# Patient Record
Sex: Female | Born: 1955
Health system: Southern US, Community
[De-identification: ages and names within clinical notes are randomized; demographics above are authoritative.]

## PROBLEM LIST (undated history)

## (undated) ENCOUNTER — Emergency Department (HOSPITAL_COMMUNITY): Admission: EM | Discharge status: Absent without leave | Payer: BC Managed Care – PPO

## (undated) DIAGNOSIS — D649 Anemia, unspecified: Secondary | ICD-10-CM

## (undated) DIAGNOSIS — E785 Hyperlipidemia, unspecified: Secondary | ICD-10-CM

## (undated) DIAGNOSIS — Z9289 Personal history of other medical treatment: Secondary | ICD-10-CM

## (undated) DIAGNOSIS — I10 Essential (primary) hypertension: Secondary | ICD-10-CM

## (undated) HISTORY — DX: Personal history of other medical treatment: Z92.89

## (undated) HISTORY — DX: Essential (primary) hypertension: I10

## (undated) HISTORY — DX: Anemia, unspecified: D64.9

## (undated) HISTORY — DX: Hyperlipidemia, unspecified: E78.5

---

## 2007-04-30 ENCOUNTER — Encounter: Admission: RE | Admit: 2007-04-30 | Discharge: 2007-04-30 | Payer: Self-pay | Admitting: Obstetrics and Gynecology

## 2010-10-02 ENCOUNTER — Encounter: Payer: Self-pay | Admitting: Obstetrics and Gynecology

## 2011-04-06 ENCOUNTER — Encounter: Payer: Self-pay | Admitting: Podiatry

## 2011-09-12 DIAGNOSIS — Z9289 Personal history of other medical treatment: Secondary | ICD-10-CM

## 2011-09-12 HISTORY — DX: Personal history of other medical treatment: Z92.89

## 2012-04-18 ENCOUNTER — Telehealth: Payer: Self-pay | Admitting: Oncology

## 2012-04-18 NOTE — Telephone Encounter (Signed)
lmonvm adviisng the pt to call me back to set up a new pt consult with dr ha at [facility]

## 2012-04-19 ENCOUNTER — Telehealth: Payer: Self-pay | Admitting: Oncology

## 2012-04-19 NOTE — Telephone Encounter (Signed)
lmonvm of the pt again regarding an new pt appt with dr Gaylyn Rong for next weekper dr cammie fulp

## 2012-04-22 ENCOUNTER — Telehealth: Payer: Self-pay | Admitting: Oncology

## 2012-04-22 NOTE — Telephone Encounter (Signed)
This is the second message left for this pt for her to return my call to set up,an appt. Pt has been referred by dr Jillyn Hidden

## 2012-04-23 ENCOUNTER — Telehealth: Payer: Self-pay | Admitting: Oncology

## 2012-04-23 NOTE — Telephone Encounter (Signed)
lmonvm of the pt regarding her needing a new pt appt with dr ha. The phone number given was not the correct phone number for the pt. Tried the pt at the [phone] exchange and lmonvm. Asked the pt to call me back asap.

## 2012-04-26 ENCOUNTER — Telehealth: Payer: Self-pay | Admitting: Oncology

## 2012-04-26 NOTE — Telephone Encounter (Signed)
The pt never called back to schedule an appt the chart has been returned back to the referring office

## 2012-05-09 ENCOUNTER — Other Ambulatory Visit: Payer: Self-pay | Admitting: Gastroenterology

## 2012-05-09 DIAGNOSIS — R748 Abnormal levels of other serum enzymes: Secondary | ICD-10-CM

## 2012-05-10 ENCOUNTER — Other Ambulatory Visit: Payer: Self-pay

## 2012-05-11 ENCOUNTER — Inpatient Hospital Stay (HOSPITAL_COMMUNITY): Payer: BC Managed Care – PPO

## 2012-05-11 ENCOUNTER — Encounter (HOSPITAL_COMMUNITY): Payer: Self-pay | Admitting: Emergency Medicine

## 2012-05-11 ENCOUNTER — Observation Stay (HOSPITAL_COMMUNITY)
Admission: EM | Admit: 2012-05-11 | Discharge: 2012-05-12 | DRG: 395 | Disposition: A | Discharge status: Home / Self Care | Payer: BC Managed Care – PPO | Attending: Internal Medicine | Admitting: Internal Medicine

## 2012-05-11 DIAGNOSIS — D6489 Other specified anemias: Principal | ICD-10-CM | POA: Diagnosis present

## 2012-05-11 DIAGNOSIS — R161 Splenomegaly, not elsewhere classified: Secondary | ICD-10-CM | POA: Diagnosis present

## 2012-05-11 DIAGNOSIS — D649 Anemia, unspecified: Secondary | ICD-10-CM

## 2012-05-11 DIAGNOSIS — I498 Other specified cardiac arrhythmias: Secondary | ICD-10-CM | POA: Diagnosis present

## 2012-05-11 DIAGNOSIS — Z791 Long term (current) use of non-steroidal anti-inflammatories (NSAID): Secondary | ICD-10-CM

## 2012-05-11 DIAGNOSIS — R000 Tachycardia, unspecified: Secondary | ICD-10-CM

## 2012-05-11 DIAGNOSIS — N1330 Unspecified hydronephrosis: Secondary | ICD-10-CM | POA: Diagnosis present

## 2012-05-11 DIAGNOSIS — R51 Headache: Secondary | ICD-10-CM | POA: Diagnosis present

## 2012-05-11 LAB — COMPREHENSIVE METABOLIC PANEL
ALT: 34 U/L (ref 0–35)
AST: 51 U/L — ABNORMAL HIGH (ref 0–37)
Albumin: 2.8 g/dL — ABNORMAL LOW (ref 3.5–5.2)
Alkaline Phosphatase: 177 U/L — ABNORMAL HIGH (ref 39–117)
BUN: 7 mg/dL (ref 6–23)
CO2: 25 mEq/L (ref 19–32)
Calcium: 9.1 mg/dL (ref 8.4–10.5)
Chloride: 101 mEq/L (ref 96–112)
Creatinine, Ser: 0.64 mg/dL (ref 0.50–1.10)
GFR calc Af Amer: >90 mL/min (ref >90)
GFR calc non Af Amer: >90 mL/min (ref >90)
Glucose, Bld: 133 mg/dL — ABNORMAL HIGH (ref 70–99)
Potassium: 3.9 mEq/L (ref 3.5–5.1)
Sodium: 134 mEq/L — ABNORMAL LOW (ref 135–145)
Total Bilirubin: 0.9 mg/dL (ref 0.3–1.2)
Total Protein: 7.8 g/dL (ref 6.0–8.3)

## 2012-05-11 LAB — CBC WITH DIFFERENTIAL/PLATELET
Basophils Absolute: 0.1 10*3/uL (ref 0.0–0.1)
Basophils Relative: 1 % (ref 0–1)
Eosinophils Absolute: 0.1 10*3/uL (ref 0.0–0.7)
Eosinophils Relative: 1 % (ref 0–5)
HCT: 21.2 % — ABNORMAL LOW (ref 36.0–46.0)
Hemoglobin: 6.5 g/dL — CL (ref 12.0–15.0)
Lymphocytes Relative: 40 % (ref 12–46)
Lymphs Abs: 2.6 10*3/uL (ref 0.7–4.0)
MCH: 23.7 pg — ABNORMAL LOW (ref 26.0–34.0)
MCHC: 30.7 g/dL (ref 30.0–36.0)
MCV: 77.4 fL — ABNORMAL LOW (ref 78.0–100.0)
Monocytes Absolute: 1.8 10*3/uL — ABNORMAL HIGH (ref 0.1–1.0)
Monocytes Relative: 27 % — ABNORMAL HIGH (ref 3–12)
Neutro Abs: 2 10*3/uL (ref 1.7–7.7)
Neutrophils Relative %: 31 % — ABNORMAL LOW (ref 43–77)
Platelets: 159 10*3/uL (ref 150–400)
RBC: 2.74 MIL/uL — ABNORMAL LOW (ref 3.87–5.11)
RDW: 21.9 % — ABNORMAL HIGH (ref 11.5–15.5)
WBC: 6.6 10*3/uL (ref 4.0–10.5)

## 2012-05-11 LAB — URINALYSIS, ROUTINE W REFLEX MICROSCOPIC
Bilirubin Urine: NEGATIVE
Glucose, UA: NEGATIVE mg/dL
Hgb urine dipstick: NEGATIVE
Ketones, ur: NEGATIVE mg/dL
Leukocytes, UA: NEGATIVE
Nitrite: NEGATIVE
Protein, ur: NEGATIVE mg/dL
Specific Gravity, Urine: 1.008 (ref 1.005–1.030)
Urobilinogen, UA: 1 mg/dL (ref 0.0–1.0)
pH: 7 (ref 5.0–8.0)

## 2012-05-11 LAB — RETICULOCYTES
RBC.: 2.74 MIL/uL — ABNORMAL LOW (ref 3.87–5.11)
Retic Count, Absolute: 208.2 10*3/uL — ABNORMAL HIGH (ref 19.0–186.0)
Retic Ct Pct: 7.6 % — ABNORMAL HIGH (ref 0.4–3.1)

## 2012-05-11 LAB — FERRITIN: Ferritin: 1088 ng/mL — ABNORMAL HIGH (ref 10–291)

## 2012-05-11 LAB — TROPONIN I: Troponin I: <0.3 ng/mL (ref <0.30)

## 2012-05-11 LAB — MRSA PCR SCREENING: MRSA by PCR: NEGATIVE

## 2012-05-11 LAB — PROTIME-INR
INR: 1.09 (ref 0.00–1.49)
Prothrombin Time: 14.3 seconds (ref 11.6–15.2)

## 2012-05-11 LAB — POCT I-STAT TROPONIN I: Troponin i, poc: 0 ng/mL (ref 0.00–0.08)

## 2012-05-11 LAB — FOLATE: Folate: 19.8 ng/mL

## 2012-05-11 LAB — TSH: TSH: 1.8 u[IU]/mL (ref 0.350–4.500)

## 2012-05-11 LAB — APTT: aPTT: 35 seconds (ref 24–37)

## 2012-05-11 LAB — MAGNESIUM: Magnesium: 1.8 mg/dL (ref 1.5–2.5)

## 2012-05-11 LAB — ABO/RH: ABO/RH(D): A POS

## 2012-05-11 LAB — IRON AND TIBC
Iron: 97 ug/dL (ref 42–135)
Saturation Ratios: 34 % (ref 20–55)
TIBC: 282 ug/dL (ref 250–470)
UIBC: 185 ug/dL (ref 125–400)

## 2012-05-11 LAB — PHOSPHORUS: Phosphorus: 3.4 mg/dL (ref 2.3–4.6)

## 2012-05-11 LAB — PREPARE RBC (CROSSMATCH)

## 2012-05-11 LAB — VITAMIN B12: Vitamin B-12: 1264 pg/mL — ABNORMAL HIGH (ref 211–911)

## 2012-05-11 LAB — OCCULT BLOOD, POC DEVICE: Fecal Occult Bld: NEGATIVE

## 2012-05-11 MED ORDER — LEVALBUTEROL HCL 0.63 MG/3ML IN NEBU
0.6300 mg | INHALATION_SOLUTION | Freq: Four times a day (QID) | RESPIRATORY_TRACT | Status: DC | PRN
  Filled 2012-05-11: qty 3

## 2012-05-11 MED ORDER — ONDANSETRON HCL 4 MG/2ML IJ SOLN: 4.0000 mg | Freq: Four times a day (QID) | INTRAMUSCULAR | Status: DC | PRN

## 2012-05-11 MED ORDER — ACETAMINOPHEN 325 MG PO TABS: 650.0000 mg | ORAL_TABLET | Freq: Four times a day (QID) | ORAL | Status: DC | PRN

## 2012-05-11 MED ORDER — SODIUM CHLORIDE 0.9 % IV SOLN
8.0000 mg/h | INTRAVENOUS | Status: DC
  Administered 2012-05-11 (×2): 8 mg/h via INTRAVENOUS
  Filled 2012-05-11 (×6): qty 80

## 2012-05-11 MED ORDER — SODIUM CHLORIDE 0.9 % IV SOLN
INTRAVENOUS | Status: DC
  Administered 2012-05-11: 14:00:00 via INTRAVENOUS
  Administered 2012-05-12: 1000 mL via INTRAVENOUS

## 2012-05-11 MED ORDER — ACETAMINOPHEN 650 MG RE SUPP: 650.0000 mg | Freq: Four times a day (QID) | RECTAL | Status: DC | PRN

## 2012-05-11 MED ORDER — SODIUM CHLORIDE 0.9 % IV SOLN
80.0000 mg | Freq: Once | INTRAVENOUS | Status: AC
  Administered 2012-05-11: 80 mg via INTRAVENOUS
  Filled 2012-05-11: qty 80

## 2012-05-11 MED ORDER — ONDANSETRON HCL 4 MG PO TABS: 4.0000 mg | ORAL_TABLET | Freq: Four times a day (QID) | ORAL | Status: DC | PRN

## 2012-05-11 NOTE — ED Provider Notes (Signed)
History     CSN: [id number]  Arrival date & time 05/11/12  0830   First MD Initiated Contact with Patient 05/11/12 [phone]      Chief Complaint  Patient presents with  . Anemia    (Consider location/radiation/quality/duration/timing/severity/associated sxs/prior treatment) The history is provided by the patient and a relative.    Kelly Arnold is a 56 y.o. female INAD advised to report to the emergency room for CV she states showing a hemoglobin of 6.4 yesterday. Patient did not come to the emergency room yesterday because she had house guest. Patient has reported malaise worsening over the last month. She reports DOE, and HA. She denies any chest pain, palpitations, abdominal pain, nausea or vomiting, diarrhea, hematemesis or hematochezia. She affirms dark stool and attributes that to iron supplementation. She's never had a colonoscopy. She has taken Motrin 2 times in the past several weeks for shoulder and neck pain. She denies any alcohol use. Patient recently traveled to [place] spent 3 weeks in the beginning of July. She was not prophylaxed for malaria. He should is postmenopausal by 3 years and has not experienced any postmenopausal bleed.  GI [facility]  PCP Fulp  History reviewed. No pertinent past medical history.  History reviewed. No pertinent past surgical history.  No family history on file.  History  Substance Use Topics  . Smoking status: Never Smoker   . Smokeless tobacco: Never Used  . Alcohol Use: No    OB History    Grav Para Term Preterm Abortions TAB SAB Ect Mult Living                  Review of Systems  Respiratory: Positive for shortness of breath.   Cardiovascular: Negative for chest pain, palpitations and leg swelling.  Gastrointestinal: Positive for abdominal pain. Negative for blood in stool and anal bleeding.  All other systems reviewed and are negative.    Allergies  Review of patient's allergies indicates no known allergies.  Home  Medications  No current outpatient prescriptions on file.  BP 120/59  Pulse 106  Temp 98.4 F (36.9 C) (Oral)  Resp 20  SpO2 100%  Physical Exam  Nursing note and vitals reviewed. Constitutional: She is oriented to person, place, and time. She appears well-developed and well-nourished. No distress.  HENT:  Head: Normocephalic and atraumatic.       Pale palpebral conjunctiva.  Eyes: Conjunctivae and EOM are normal. Pupils are equal, round, and reactive to light.  Neck: Normal range of motion. No JVD present.  Cardiovascular: Regular rhythm and intact distal pulses.  Exam reveals friction rub. Exam reveals no gallop.   No murmur heard.      Tachycardic  Pulmonary/Chest: Effort normal.  Abdominal: Soft. Bowel sounds are normal. She exhibits no distension and no mass. There is no tenderness. There is no rebound and no guarding.  Musculoskeletal: Normal range of motion.  Neurological: She is alert and oriented to person, place, and time.  Skin: Skin is warm.  Psychiatric: She has a normal mood and affect.    ED Course  Procedures (including critical care time)  Labs Reviewed  CBC WITH DIFFERENTIAL - Abnormal; Notable for the following:    RBC 2.74 (*)     Hemoglobin 6.5 (*)     HCT 21.2 (*)     MCV 77.4 (*)     MCH 23.7 (*)     RDW 21.9 (*)     All other components within normal limits  COMPREHENSIVE  METABOLIC PANEL - Abnormal; Notable for the following:    Sodium 134 (*)     Glucose, Bld 133 (*)     Albumin 2.8 (*)     AST 51 (*)     Alkaline Phosphatase 177 (*)     All other components within normal limits  RETICULOCYTES - Abnormal; Notable for the following:    Retic Ct Pct 7.6 (*)     RBC. 2.74 (*)     Retic Count, Manual 208.2 (*)     All other components within normal limits  TYPE AND SCREEN  URINALYSIS, ROUTINE W REFLEX MICROSCOPIC  PROTIME-INR  APTT  OCCULT BLOOD, POC DEVICE  ABO/RH  PREPARE RBC (CROSSMATCH)  VITAMIN B12  FOLATE  IRON AND TIBC    FERRITIN  OCCULT BLOOD X 1 CARD TO LAB, STOOL   No results found.   Date: 05/11/2012  Rate: 95  Rhythm: normal sinus rhythm  QRS Axis: normal  Intervals: normal  ST/T Wave abnormalities: normal  Conduction Disutrbances:none  Narrative Interpretation:   Old EKG Reviewed: none available    1. Anemia       MDM  56 y.o. female sent from GI Dr. for anemia. Will confirm a hemoglobin prep for transfusion and assessed cause with guaiac anemia panel. She is mildly tachycardic at 105 no respiratory issues saturating 100% on room air.   Guaiac is negative patient's hemoglobin is 6.5. I will crossmatch 2 units of pRBCs and start the first unit transfusion. She will beadmitted for symptomatic anemia of unknown origin.   Pt will be admitted to telemetry bed under Care of Dr. Susie Cassette, triad team 4         [facility], PA-C 05/11/12 1058

## 2012-05-11 NOTE — H&P (Signed)
Chief Complaint  Patient presents with  .  Anemia   GI Ganem  PCP Fulp   [place] Kelly Arnold is a 56 y.o. Female who was  advised to report to the emergency room for her anemia . she states that she was reported to have a  hemoglobin of 6.4 yesterday. Patient did not come to the emergency room yesterday because she had house guest. Patient has reported malaise worsening over the last month. She reports DOE, and HA. She denies any chest pain, palpitations, abdominal pain, nausea or vomiting, diarrhea, hematemesis or hematochezia. She affirms dark stool and attributes that to iron supplementation. She's never had a colonoscopy. She has taken Motrin 2 times in the past several weeks for shoulder and neck pain. She denies any alcohol use. Patient recently traveled to [place] spent 3 weeks in the beginning of July. She was not prophylaxed for malaria. She is postmenopausal by 3 years and has not experienced any postmenopausal bleed.    History reviewed. No pertinent past medical history.  History reviewed. No pertinent past surgical history.    Family history reviwed and negative .  History   Substance Use Topics   .  Smoking status:  Never Smoker   .  Smokeless tobacco:  Never Used   .  Alcohol Use:  No    OB History    Grav  Para  Term  Preterm  Abortions  TAB  SAB  Ect  Mult  Living                  Review of Systems  Respiratory: Positive for shortness of breath.  Cardiovascular: Negative for chest pain, palpitations and leg swelling.  Gastrointestinal: Positive for abdominal pain. Negative for blood in stool and anal bleeding.  Musculoskeletal: Arthritis, abnormal weakness of muscles  Breasts: New breast lumps, nipple discharge or irritation  Neurological: Dizziness, numbing/tingling, loss of balance when walking  Psychiatric: Depression, mood swings, significant and unexplained memory loss  Endocrine: Excessively hot or cold, unexplained weight loss or gain  Hematologic/Lymphatic:  Swollen glands, easy bruising  Allergic/Immunologic: Hives, runny nose, sneezing     Allergies   Review of patient's allergies indicates no known allergies.  Home Medications   No current outpatient prescriptions on file.  BP 120/59  Pulse 106  Temp 98.4 F (36.9 C) (Oral)  Resp 20  SpO2 100%  Physical Exam  Nursing note and vitals reviewed.  Constitutional: She is oriented to person, place, and time. She appears well-developed and well-nourished. No distress.  HENT:  Head: Normocephalic and atraumatic.  Pale palpebral conjunctiva.  Eyes: Conjunctivae and EOM are normal. Pupils are equal, round, and reactive to light.  Neck: Normal range of motion. No JVD present.  Cardiovascular: Regular rhythm and intact distal pulses. Exam reveals friction rub. Exam reveals no gallop.  No murmur heard. Tachycardic  Pulmonary/Chest: Effort normal.  Abdominal: Soft. Bowel sounds are normal. She exhibits no distension and no mass. There is no tenderness. There is no rebound and no guarding.  Musculoskeletal: Normal range of motion.  Neurological: She is alert and oriented to person, place, and time.  Skin: Skin is warm.  Psychiatric: She has a normal mood and affect.   ED Course   Procedures (including critical care time)  Labs Reviewed   CBC WITH DIFFERENTIAL - Abnormal; Notable for the following:    RBC  2.74 (*)      Hemoglobin  6.5 (*)      HCT  21.2 (*)  MCV  77.4 (*)      MCH  23.7 (*)      RDW  21.9 (*)      All other components within normal limits   COMPREHENSIVE METABOLIC PANEL - Abnormal; Notable for the following:    Sodium  134 (*)      Glucose, Bld  133 (*)      Albumin  2.8 (*)      AST  51 (*)      Alkaline Phosphatase  177 (*)      All other components within normal limits   RETICULOCYTES - Abnormal; Notable for the following:    Retic Ct Pct  7.6 (*)      RBC.  2.74 (*)      Retic Count, Manual  208.2 (*)      All other components within normal limits     TYPE AND SCREEN   OCCULT BLOOD, POC DEVICE   URINALYSIS, ROUTINE W REFLEX MICROSCOPIC   VITAMIN B12   FOLATE   IRON AND TIBC   FERRITIN   PROTIME-INR   APTT   OCCULT BLOOD X 1 CARD TO LAB, STOOL   ABO/RH   PREPARE RBC (CROSSMATCH)    No results found.  Date: 05/11/2012  Rate: 95  Rhythm: normal sinus rhythm  QRS Axis: normal  Intervals: normal  ST/T Wave abnormalities: normal  Conduction Disutrbances:none  Narrative Interpretation:  Old EKG Reviewed: none available   Assessment and Plan  1. sympotamatic anemia , likely upper GI source , takes motrin at home, will need an EGD , will notify Dr Evette Cristal   2. Headache rebound HA , will hold of on NSADIS , ASA , at this time  3. SOB check BNP, and 2 D ECHO , cycle enzymes  4. Sinus tachycardia due to anemia

## 2012-05-11 NOTE — ED Notes (Signed)
Pt not feeling well for several days, Went tp PCP who sent pt to GI physician. Labs done and pt received information from office that HGB 6.4 and told pt to come to ED

## 2012-05-12 DIAGNOSIS — I498 Other specified cardiac arrhythmias: Secondary | ICD-10-CM

## 2012-05-12 LAB — COMPREHENSIVE METABOLIC PANEL
ALT: 29 U/L (ref 0–35)
AST: 42 U/L — ABNORMAL HIGH (ref 0–37)
Albumin: 2.3 g/dL — ABNORMAL LOW (ref 3.5–5.2)
Alkaline Phosphatase: 135 U/L — ABNORMAL HIGH (ref 39–117)
BUN: 5 mg/dL — ABNORMAL LOW (ref 6–23)
CO2: 24 mEq/L (ref 19–32)
Calcium: 8.5 mg/dL (ref 8.4–10.5)
Chloride: 109 mEq/L (ref 96–112)
Creatinine, Ser: 0.68 mg/dL (ref 0.50–1.10)
GFR calc Af Amer: >90 mL/min (ref >90)
GFR calc non Af Amer: >90 mL/min (ref >90)
Glucose, Bld: 86 mg/dL (ref 70–99)
Potassium: 4.1 mEq/L (ref 3.5–5.1)
Sodium: 139 mEq/L (ref 135–145)
Total Bilirubin: 0.8 mg/dL (ref 0.3–1.2)
Total Protein: 6.6 g/dL (ref 6.0–8.3)

## 2012-05-12 LAB — CBC
HCT: 23.4 % — ABNORMAL LOW (ref 36.0–46.0)
Hemoglobin: 7.3 g/dL — ABNORMAL LOW (ref 12.0–15.0)
MCH: 24.4 pg — ABNORMAL LOW (ref 26.0–34.0)
MCHC: 31.2 g/dL (ref 30.0–36.0)
MCV: 78.3 fL (ref 78.0–100.0)
Platelets: 157 10*3/uL (ref 150–400)
RBC: 2.99 MIL/uL — ABNORMAL LOW (ref 3.87–5.11)
RDW: 21.7 % — ABNORMAL HIGH (ref 11.5–15.5)
WBC: 8 10*3/uL (ref 4.0–10.5)

## 2012-05-12 LAB — TROPONIN I
Troponin I: <0.3 ng/mL (ref <0.30)
Troponin I: <0.3 ng/mL (ref <0.30)

## 2012-05-12 LAB — PRO B NATRIURETIC PEPTIDE: Pro B Natriuretic peptide (BNP): 148.3 pg/mL — ABNORMAL HIGH (ref 0–125)

## 2012-05-12 MED ORDER — OMEPRAZOLE 40 MG PO CPDR: 40.0000 mg | DELAYED_RELEASE_CAPSULE | Freq: Two times a day (BID) | ORAL | Status: DC

## 2012-05-12 MED ORDER — FERROUS SULFATE 325 (65 FE) MG PO TABS: 325.0000 mg | ORAL_TABLET | Freq: Every day | ORAL | Status: DC

## 2012-05-12 NOTE — Discharge Summary (Signed)
Physician Discharge Summary  Kelly Arnold BJY:[id number] DOB: 1956-06-23 DOA: 05/11/2012  PCP: No primary provider on file.  Admit date: 05/11/2012 Discharge date: 05/12/2012  Recommendations for Outpatient Follow-up:  Follow-up Information    Follow up with FULP, CAMMIE, MD. (in 1-2weeks, call for appt upon discharge.)    Contact information:   [address] [place] [address] [place] [address] [phone]       Follow up with Graylin Shiver, MD on 05/14/2012. (as scheduled)    Contact information:   [address], [address] [place] [address] [phone]          Discharge Diagnoses:  Active Problems:  Normocytic anemia  Sinus tachycardia   Discharge Condition: Improved/stable  Diet recommendation: Regular diet  Filed Weights   05/11/12 1315 05/12/12 0400  Weight: 72.3 kg (159 lb 6.3 oz) 76.4 kg (168 lb 6.9 oz)    History of present illness:  Kelly Arnold is a 56 y.o. Female who was advised to report to the emergency room for her anemia . she states that she was reported to have a hemoglobin of 6.4 yesterday. Patient did not come to the emergency room yesterday because she had house guest. Patient has reported malaise worsening over the last month. She reports DOE, and HA. She denies any chest pain, palpitations, abdominal pain, nausea or vomiting, diarrhea, hematemesis or hematochezia. She affirms dark stool and attributes that to iron supplementation. She's never had a colonoscopy. She has taken Motrin 2 times in the past several weeks for shoulder and neck pain. She denies any alcohol use. Patient recently traveled to [place] spent 3 weeks in the beginning of July. She was not prophylaxed for malaria. She is 3 years postmenopausal  and has not experienced any postmenopausal bleed.  On followup today she denies dizziness, DOE chest pain, melena, hematochezia. She attributes the dark stools she was having prior to admission to supplemental  iron.  Hospital Course by problem list:  1. sympotamatic anemia ,  -possibly upper GI source , given history of Motrin use although she insists that she's only taken it twice recently. -GI consult for per Dr. Susie Cassette on 8/31, nursing staff contacted Dr. Katha Hamming. on call for GI today and he states he doesn't need to  see patient in house as he states he discussed with Dr. Susie Cassette, that she needs to keep the appointment scheduled on 9/3 with Dr. Evette Cristal at the office. -Hemoglobin status post transfusion of 2 units of packed red blood cells improved to 7.3, and patient denies any further symptoms at this time. -Patient instructed to avoid NSAIDs -she is being discharged on iron and Prilosec for outpatient followup as above 2. Headache rebound HA- as above pt instructed to avoid anti-inflammatories secondary to #1  3. DOE -likely secondary to #1, patient refused 2-D echo and BNP is unremarkable. 4. Sinus tachycardia -resolved with transfusion & hydration and due to anemia  5.splenomegaly -results of ultrasound discussed with patient, she is to followup with GI outpatient as discussed above   and may possibly need hematology referral. 6. mild hydronephrosis-patient is voiding without difficulty and creatinine within normal limits, discussed with patient and she is to followup her PCP outpatient.   Procedures:  NONE She declined 2-D echocardiogram Consultations:  GI-Dr. Ewing Schlein requested the patient to follow up outpatient with Dr.Ganem on 9/3 as scheduled.Marland Kitchen  Discharge Exam: Filed Vitals:   05/12/12 1100  BP: 115/69  Pulse: 80  Temp:   Resp: 21   Filed Vitals:  05/12/12 0800 05/12/12 0900 05/12/12 1000 05/12/12 1100  BP: 124/70 126/76 124/69 115/69  Pulse: 81 83 83 80  Temp: 98.8 F (37.1 C)     TempSrc: Oral     Resp: 18 17 17 21   Height:      Weight:      SpO2: 100% 100% 100% 100%    General: Middle aged black female, alert and oriented x3 in no apparent distress. Cardiovascular:  Regular rate and rhythm, S1-S2. No murmurs and no gallops Respiratory: Clear to auscultation bilaterally no crackles and no wheezes Abdomen: Soft bowel sounds present nontender nondistended no organomegaly and no masses palpable Extremities: No cyanosis and no edema  Discharge Instructions  Discharge Orders    Future Orders Please Complete By Expires   Diet general      Increase activity slowly        Medication List  As of 05/12/2012  2:00 PM   TAKE these medications         ferrous sulfate 325 (65 FE) MG tablet   Take 1 tablet (325 mg total) by mouth daily with breakfast.      multivitamin with minerals Tabs   Take 1 tablet by mouth daily.      omeprazole 40 MG capsule   Commonly known as: PRILOSEC   Take 1 capsule (40 mg total) by mouth 2 (two) times daily before a meal.           Patient instructed to avoid anti-inflammatories including Motrin Follow-up Information    Follow up with FULP, CAMMIE, MD. (in 1-2weeks, call for appt upon discharge.)    Contact information:   [address], [place] [address] [place] [address] [phone]       Follow up with Graylin Shiver, MD on 05/14/2012. (as scheduled)    Contact information:   [address] [place] [address] [phone]           The results of significant diagnostics from this hospitalization (including imaging, microbiology, ancillary and laboratory) are listed below for reference.    Significant Diagnostic Studies: US Abdomen Complete  05/11/2012  *RADIOLOGY REPORT*  Clinical Data:  Elevated liver function tests.  Anemia.  COMPLETE ABDOMINAL ULTRASOUND  Comparison:  None.  Findings:  Gallbladder:  No gallstones, gallbladder wall thickening, or pericholecystic fluid.  Common bile duct:  Normal, measuring 2.9 mm in diameter proximally.  Liver:  No focal lesion identified.  Within normal limits in parenchymal echogenicity.  IVC:  Appears normal.  Pancreas:  No focal abnormality seen.   Spleen:  Enlarged, with a calculated volume of 680 ml and length of approximately 15 cm.  It could not be included in its entirety on one image.  Right Kidney:  Mildly dilated collecting system.  Otherwise, normal, measuring 11.1 cm in length.  Left Kidney:  Normal, measuring 10.6 cm in length.  Abdominal aorta:  No aneurysm identified.  IMPRESSION:  1.  Splenomegaly. 2.  Normal appearing liver and no biliary obstruction. 3.  Mild right hydronephrosis.   Original Report Authenticated By: Darrol Angel, M.D.     Microbiology: Recent Results (from the past 240 hour(s))  MRSA PCR SCREENING     Status: Normal   Collection Time   05/11/12  1:50 PM      Component Value Range Status Comment   MRSA by PCR NEGATIVE  NEGATIVE Final      Labs: Basic Metabolic Panel:  Lab 05/12/12 2440 05/11/12 1411 05/11/12 0910  NA 139 -- 134*  K 4.1 -- 3.9  CL 109 -- 101  CO2 24 -- 25  GLUCOSE 86 -- 133*  BUN 5* -- 7  CREATININE 0.68 -- 0.64  CALCIUM 8.5 -- 9.1  MG -- 1.8 --  PHOS -- 3.4 --   Liver Function Tests:  Lab 05/12/12 0615 05/11/12 0910  AST 42* 51*  ALT 29 34  ALKPHOS 135* 177*  BILITOT 0.8 0.9  PROT 6.6 7.8  ALBUMIN 2.3* 2.8*   No results found for this basename: LIPASE:5,AMYLASE:5 in the last 168 hours No results found for this basename: AMMONIA:5 in the last 168 hours CBC:  Lab 05/12/12 0615 05/11/12 0910  WBC 8.0 6.6  NEUTROABS -- 2.0  HGB 7.3* 6.5*  HCT 23.4* 21.2*  MCV 78.3 77.4*  PLT 157 159   Cardiac Enzymes:  Lab 05/12/12 0615 05/11/12 2353 05/11/12 1411  CKTOTAL -- -- --  CKMB -- -- --  CKMBINDEX -- -- --  TROPONINI <0.30 <0.30 <0.30   BNP: BNP (last 3 results)  Basename 05/12/12 0615  PROBNP 148.3*   CBG: No results found for this basename: GLUCAP:5 in the last 168 hours  Time coordinating discharge: <30 minutes     Freddie Nghiem C  [facility] Pager [phone]. If 8PM-8AM, please contact night-coverage at [email or web address], password  [facility] 05/12/2012, 9:01 AM  LOS: 1 day

## 2012-05-12 NOTE — Progress Notes (Signed)
  Echocardiogram 2D Echocardiogram has not been performed due to patient refusal. RN notified.  Mikena Masoner 05/12/2012, 8:16 AM

## 2012-05-12 NOTE — Progress Notes (Signed)
Spoke with Dr. Suanne Marker.  Patient to be discharged home today.  Patient disconnected from monitors and IV per patient request.  Awaiting discharge orders.  Will continue to monitor.

## 2012-05-13 LAB — TYPE AND SCREEN
ABO/RH(D): A POS
Antibody Screen: NEGATIVE
Unit division: 0
Unit division: 0

## 2012-05-13 NOTE — Progress Notes (Signed)
[id number] Earlene Plater, RN, BSN, CCM: CHART REVIEWED AND UPDATED. NO DISCHARGE NEEDS PRESENT AT THIS TIME. CASE MANAGEMENT [phone]

## 2012-05-25 NOTE — ED Provider Notes (Signed)
Medical screening examination/treatment/procedure(s) were conducted as a shared visit with non-physician practitioner(s) and myself.  I personally evaluated the patient during the encounter.  Symptomatic anemia.  Will admit and transfuse.  Donnetta Hutching, MD 05/25/12 [phone]

## 2016-04-10 ENCOUNTER — Other Ambulatory Visit: Payer: Self-pay | Admitting: Family Medicine

## 2016-04-10 ENCOUNTER — Other Ambulatory Visit (HOSPITAL_COMMUNITY)
Admission: RE | Admit: 2016-04-10 | Discharge: 2016-04-10 | Disposition: A | Discharge status: Home / Self Care | Payer: BC Managed Care – PPO | Source: Ambulatory Visit | Attending: Family Medicine | Admitting: Family Medicine

## 2016-04-10 DIAGNOSIS — Z01419 Encounter for gynecological examination (general) (routine) without abnormal findings: Secondary | ICD-10-CM | POA: Insufficient documentation

## 2016-04-12 LAB — CYTOLOGY - PAP

## 2016-05-06 ENCOUNTER — Other Ambulatory Visit: Payer: Self-pay | Admitting: Family Medicine

## 2016-05-06 DIAGNOSIS — Z1231 Encounter for screening mammogram for malignant neoplasm of breast: Secondary | ICD-10-CM

## 2018-04-01 ENCOUNTER — Other Ambulatory Visit: Payer: Self-pay | Admitting: Obstetrics and Gynecology

## 2018-04-01 ENCOUNTER — Other Ambulatory Visit (HOSPITAL_COMMUNITY)
Admission: RE | Admit: 2018-04-01 | Discharge: 2018-04-01 | Disposition: A | Discharge status: Home / Self Care | Payer: BC Managed Care – PPO | Source: Ambulatory Visit | Attending: Obstetrics and Gynecology | Admitting: Obstetrics and Gynecology

## 2018-04-01 DIAGNOSIS — Z01411 Encounter for gynecological examination (general) (routine) with abnormal findings: Secondary | ICD-10-CM | POA: Diagnosis not present

## 2018-04-04 LAB — CYTOLOGY - PAP
Diagnosis: NEGATIVE
HPV 16/18/45 genotyping: NEGATIVE
HPV: DETECTED — AB

## 2018-04-12 ENCOUNTER — Other Ambulatory Visit: Payer: Self-pay | Admitting: Obstetrics and Gynecology

## 2018-04-12 DIAGNOSIS — Z1231 Encounter for screening mammogram for malignant neoplasm of breast: Secondary | ICD-10-CM

## 2018-05-29 ENCOUNTER — Ambulatory Visit
Admission: RE | Admit: 2018-05-29 | Discharge: 2018-05-29 | Disposition: A | Discharge status: Home / Self Care | Payer: BC Managed Care – PPO | Source: Ambulatory Visit | Attending: Obstetrics and Gynecology | Admitting: Obstetrics and Gynecology

## 2018-05-29 DIAGNOSIS — Z1231 Encounter for screening mammogram for malignant neoplasm of breast: Secondary | ICD-10-CM

## 2018-09-27 ENCOUNTER — Encounter: Payer: Self-pay | Admitting: Family Medicine

## 2018-09-27 ENCOUNTER — Ambulatory Visit: Payer: BC Managed Care – PPO | Attending: Internal Medicine | Admitting: Family Medicine

## 2018-09-27 VITALS — BP 137/80 | HR 56 | Temp 98.6°F | Resp 18

## 2018-09-27 DIAGNOSIS — Z862 Personal history of diseases of the blood and blood-forming organs and certain disorders involving the immune mechanism: Secondary | ICD-10-CM | POA: Diagnosis not present

## 2018-09-27 DIAGNOSIS — R5383 Other fatigue: Secondary | ICD-10-CM | POA: Diagnosis not present

## 2018-09-27 LAB — POCT URINALYSIS DIP (CLINITEK)
Bilirubin, UA: NEGATIVE
Blood, UA: NEGATIVE
Glucose, UA: NEGATIVE mg/dL
Ketones, POC UA: NEGATIVE mg/dL
Leukocytes, UA: NEGATIVE
Nitrite, UA: NEGATIVE
POC PROTEIN,UA: NEGATIVE
Spec Grav, UA: <=1.005 — AB
Urobilinogen, UA: 0.2 U/dL
pH, UA: 5.5

## 2018-09-27 NOTE — Progress Notes (Signed)
Subjective:    Patient ID: Kelly Arnold, female    DOB: 10/13/1955, 63 y.o.   MRN: [id number]  HPI       63 yo female new to the practice. On review of chart, patient has had prior hospitalization from 05/11/2012-05/12/2012 due to anemia with Hgb of 6.4. Patient had transfusion of 2 units of PRBC at that time with Hgb improving to 7.3. Patient had abnormal abdominal US showing enlargement of the spleen as well as mild right hydronephrosis and she was to follow-up with GI and establish with a PCP s/p hospital discharge. Patient was discharged at that time on iron supplement and prilosec. Patient did not keep follow-up with GI for EGD and colonoscopy.  She reports that her mother passed away in 08-03-2023 and her husband was sick and she had to care for him and this caused her to postpone her follow-up with GI. She has noticed that she has had some increased fatigue.  Patient denies any fever, chills or night sweats.  Patient denies urinary frequency, urgency or dysuria.  Patient has had no unusual bruising or bleeding.  Patient has noticed that she sometimes feels colder than other people.  Past Medical History:  Diagnosis Date  . History of transfusion   . Normocytic anemia    Past Surgical History:  Procedure Laterality Date  . CESAREAN SECTION     Family History  Problem Relation Age of Onset  . Hypertension Neg Hx   . Diabetes Neg Hx   . CAD Neg Hx   . CVA Neg Hx   . Cancer Neg Hx    Social History   Tobacco Use  . Smoking status: Never Smoker  . Smokeless tobacco: Never Used  Substance Use Topics  . Alcohol use: No  . Drug use: No   No Known Allergies     Review of Systems  Constitutional: Positive for fatigue. Negative for chills and fever.  HENT: Negative for sore throat and trouble swallowing.   Eyes: Negative for photophobia and visual disturbance.  Respiratory: Negative for cough and shortness of breath.   Cardiovascular: Negative for chest pain, palpitations and leg  swelling.  Gastrointestinal: Negative for abdominal pain, constipation, diarrhea and nausea.  Endocrine: Positive for cold intolerance. Negative for heat intolerance, polydipsia, polyphagia and polyuria.  Genitourinary: Negative for dysuria and frequency.  Musculoskeletal: Negative for arthralgias, back pain, gait problem, joint swelling and myalgias.  Neurological: Negative for dizziness and headaches.  Hematological: Negative for adenopathy. Does not bruise/bleed easily.       Objective:   Physical Exam Vitals signs and nursing note reviewed.  Constitutional:      General: She is not in acute distress.    Appearance: Normal appearance. She is not ill-appearing.  HENT:     Head: Normocephalic and atraumatic.     Right Ear: Tympanic membrane, ear canal and external ear normal.     Left Ear: Tympanic membrane, ear canal and external ear normal.     Nose: Nose normal. No congestion.     Mouth/Throat:     Mouth: Mucous membranes are moist.     Pharynx: Oropharynx is clear. No oropharyngeal exudate.  Eyes:     Extraocular Movements: Extraocular movements intact.     Conjunctiva/sclera: Conjunctivae normal.  Neck:     Musculoskeletal: Normal range of motion and neck supple. No muscular tenderness.     Vascular: No carotid bruit.  Cardiovascular:     Rate and Rhythm: Normal rate and regular  rhythm.     Heart sounds: Normal heart sounds. No murmur.  Pulmonary:     Effort: Pulmonary effort is normal.     Breath sounds: Normal breath sounds.  Abdominal:     Palpations: Abdomen is soft.     Tenderness: There is no abdominal tenderness. There is no right CVA tenderness, left CVA tenderness, guarding or rebound.  Musculoskeletal: Normal range of motion.        General: No swelling or tenderness.     Right lower leg: No edema.     Left lower leg: No edema.  Lymphadenopathy:     Cervical: No cervical adenopathy.  Skin:    General: Skin is warm and dry.     Findings: No bruising or  rash.  Neurological:     General: No focal deficit present.     Mental Status: She is alert and oriented to person, place, and time.  Psychiatric:        Mood and Affect: Mood normal.        Behavior: Behavior normal.        Thought Content: Thought content normal.        Judgment: Judgment normal.    BP 137/80 (BP Location: Left Arm, Patient Position: Sitting, Cuff Size: Large)   Pulse (!) 56   Temp 98.6 F (37 C) (Oral)   Resp 18   SpO2 100%         Assessment & Plan:  1. Fatigue, unspecified type Patient with complaint of fatigue and patient does have a history of anemia but has not followed up with GI for EGD and colonoscopy.  Patient is encouraged to reschedule her appointment with GI.  Patient will have CBC in follow-up of anemia but will also have complete metabolic panel, urinalysis and TSH to look for other causes of fatigue such as elevated blood sugar, electrolyte abnormality, liver dysfunction, urinary tract infection, glucosuria or thyroid disorder.  Healthy diet, regular exercise program and sleep hygiene with goal of 8 hours of sleep nightly encouraged.  Patient denies any current depressive symptoms. - Comprehensive metabolic panel - CBC with Differential - POCT URINALYSIS DIP (CLINITEK) - TSH  2. History of normocytic normochromic anemia Patient with history of normocytic normochromic anemia which could be secondary to chronic disease or to slow/chronic GI blood loss.  Patient is encouraged to reschedule follow-up with gastroenterology and patient will have CBC at today's visit done in follow-up.  Patient will have urinalysis to look for hematuria. - CBC with Differential -UA None none none An After Visit Summary was printed and given to the patient.  Allergies as of 09/27/2018   No Known Allergies     Medication List       Accurate as of September 27, 2018 11:59 PM. Always use your most recent med list.        B-complex with vitamin C tablet Take 1  tablet by mouth daily.   multivitamin with minerals Tabs tablet Take 1 tablet by mouth daily.      Return in about 6 weeks (around 11/08/2018).

## 2018-09-28 LAB — CBC WITH DIFFERENTIAL/PLATELET
Basophils Absolute: 0 x10E3/uL (ref 0.0–0.2)
Basos: 0 %
EOS (ABSOLUTE): 0.1 x10E3/uL (ref 0.0–0.4)
Eos: 1 %
Hematocrit: 38.4 % (ref 34.0–46.6)
Hemoglobin: 12.4 g/dL (ref 11.1–15.9)
Immature Grans (Abs): 0 x10E3/uL (ref 0.0–0.1)
Immature Granulocytes: 0 %
Lymphocytes Absolute: 2.7 x10E3/uL (ref 0.7–3.1)
Lymphs: 50 %
MCH: 23.8 pg — ABNORMAL LOW (ref 26.6–33.0)
MCHC: 32.3 g/dL (ref 31.5–35.7)
MCV: 74 fL — ABNORMAL LOW (ref 79–97)
Monocytes Absolute: 0.4 x10E3/uL (ref 0.1–0.9)
Monocytes: 8 %
Neutrophils Absolute: 2.2 x10E3/uL (ref 1.4–7.0)
Neutrophils: 41 %
Platelets: 241 x10E3/uL (ref 150–450)
RBC: 5.2 x10E6/uL (ref 3.77–5.28)
RDW: 14.2 % (ref 11.7–15.4)
WBC: 5.5 x10E3/uL (ref 3.4–10.8)

## 2018-09-28 LAB — COMPREHENSIVE METABOLIC PANEL WITH GFR
ALT: 15 IU/L (ref 0–32)
AST: 19 IU/L (ref 0–40)
Albumin/Globulin Ratio: 1.6 (ref 1.2–2.2)
Albumin: 4.5 g/dL (ref 3.6–4.8)
Alkaline Phosphatase: 79 IU/L (ref 39–117)
BUN/Creatinine Ratio: 22 (ref 12–28)
BUN: 14 mg/dL (ref 8–27)
Bilirubin Total: 0.3 mg/dL (ref 0.0–1.2)
CO2: 21 mmol/L (ref 20–29)
Calcium: 9.9 mg/dL (ref 8.7–10.3)
Chloride: 105 mmol/L (ref 96–106)
Creatinine, Ser: 0.65 mg/dL (ref 0.57–1.00)
GFR calc Af Amer: 112 mL/min/1.73
GFR calc non Af Amer: 97 mL/min/1.73
Globulin, Total: 2.9 g/dL (ref 1.5–4.5)
Glucose: 89 mg/dL (ref 65–99)
Potassium: 4.6 mmol/L (ref 3.5–5.2)
Sodium: 142 mmol/L (ref 134–144)
Total Protein: 7.4 g/dL (ref 6.0–8.5)

## 2018-09-28 LAB — TSH: TSH: 2.5 u[IU]/mL (ref 0.450–4.500)

## 2018-10-08 ENCOUNTER — Telehealth: Payer: Self-pay | Admitting: *Deleted

## 2018-10-08 NOTE — Telephone Encounter (Signed)
Medical Assistant left message on patient's home and cell voicemail. Voicemail states to give a call back to [place] with [facility] at [phone]. Patient is aware of all labs being normal.

## 2018-10-08 NOTE — Telephone Encounter (Signed)
-----   Message from Cain Saupe, MD sent at 10/07/2018 11:32 PM EST ----- Please notify patient of normal urinalysis, normal complete blood count, normal comprehensive metabolic panel and normal TSH

## 2018-11-08 ENCOUNTER — Encounter: Payer: Self-pay | Admitting: Family Medicine

## 2018-11-08 ENCOUNTER — Ambulatory Visit: Payer: BC Managed Care – PPO | Attending: Family Medicine | Admitting: Family Medicine

## 2018-11-08 VITALS — BP 133/72 | HR 65 | Temp 97.8°F | Resp 16 | Wt 173.0 lb

## 2018-11-08 DIAGNOSIS — R5383 Other fatigue: Secondary | ICD-10-CM | POA: Diagnosis not present

## 2018-11-08 DIAGNOSIS — Z1211 Encounter for screening for malignant neoplasm of colon: Secondary | ICD-10-CM

## 2018-11-08 MED ORDER — THERA M PLUS PO TABS: 1.0000 | ORAL_TABLET | Freq: Every day | ORAL | 11 refills | Status: DC

## 2018-11-08 NOTE — Progress Notes (Signed)
Subjective:    Patient ID: Kelly Arnold, female    DOB: [birth year], 63 y.o.   MRN: [id number]  HPI       63 year old female seen in follow-up of fatigue.  Patient reports that her fatigue has gotten slightly better since her last visit.  Patient reports no other symptoms at today's visit.  Patient states that she did consider the colonoscopy that we discussed at last visit and patient would like to be referred for colonoscopy.  Patient has had no blood in stool, no dark stools.  Patient denies chest pain or palpitations, no headaches or dizziness.  No urinary frequency, urgency or dysuria.  No increased thirst or blurred vision.  Patient does have some occasional bilateral knee pain and popping sensation in her knees.  Patient denies any family history of diabetes, hypertension, cancers or heart disease.  Patient would like a prescription for multivitamin as she thinks that this may help her to have more energy as well. Past Medical History:  Diagnosis Date  . History of transfusion   . Normocytic anemia    Past Surgical History:  Procedure Laterality Date  . CESAREAN SECTION     Family History  Problem Relation Age of Onset  . Hypertension Neg Hx   . Diabetes Neg Hx   . CAD Neg Hx   . CVA Neg Hx   . Cancer Neg Hx    Social History   Tobacco Use  . Smoking status: Never Smoker  . Smokeless tobacco: Never Used  Substance Use Topics  . Alcohol use: No  . Drug use: No  No Known Allergies            Review of Systems  Constitutional: Positive for fatigue. Negative for chills and fever.  HENT: Negative for sore throat and trouble swallowing.   Eyes: Negative for photophobia and visual disturbance.  Respiratory: Negative for cough and shortness of breath.   Cardiovascular: Negative for chest pain, palpitations and leg swelling.  Gastrointestinal: Negative for abdominal pain, anal bleeding, blood in stool, constipation, diarrhea and nausea.  Endocrine: Negative for cold  intolerance, heat intolerance, polydipsia, polyphagia and polyuria.  Genitourinary: Negative for dysuria and frequency.  Musculoskeletal: Negative for back pain and gait problem.       Occasional knee pain  Allergic/Immunologic: Negative for environmental allergies, food allergies and immunocompromised state.  Neurological: Negative for dizziness and headaches.  Hematological: Negative for adenopathy. Does not bruise/bleed easily.       Objective:   Physical Exam BP 133/72 (BP Location: Left Arm, Patient Position: Sitting, Cuff Size: Large)   Pulse 65   Temp 97.8 F (36.6 C) (Oral)   Resp 16   Wt 173 lb (78.5 kg)   SpO2 98%   BMI 32.69 kg/m Nurse's notes and vital signs reviewed  General-well-nourished, well-developed female in no acute distress Neck-supple, no lymphadenopathy, no thyromegaly, no carotid bruit Lungs-clear to auscultation bilaterally, breathing is unlabored Cardiovascular-regular rate and regular rhythm Abdomen-soft, nontender Back-no CVA tenderness Extremities-no edema Musculoskeletal-no reproducible knee pain Psych-normal mood and judgment      Assessment & Plan:  1. Fatigue, unspecified type Patient reports that her fatigue is slightly improved.  Patient had recent normal CBC, BMP and TSH which was discussed with the patient at today's visit.  Patient requests prescription for multivitamin.  Patient was told that insurance companies generally will not cover multivitamins and suggested that she try One-A-Day women's vitamins, Centrum silver or Equate brand multivitamin but also provided prescription  for a multivitamin.  We will also check vitamin D level to see if vitamin D deficiency  may be contributing to her fatigue. - Vitamin D, 25-hydroxy - Multiple Vitamins-Minerals (MULTIVITAMINS THER. W/MINERALS) TABS tablet; Take 1 tablet by mouth daily.  Dispense: 30 each; Refill: 11  2. Screening for colon cancer Patient will be referred for screening  colonoscopy - Ambulatory referral to Gastroenterology  An After Visit Summary was printed and given to the patient. Allergies as of 11/08/2018   No Known Allergies     Medication List       Accurate as of November 08, 2018  5:16 PM. Always use your most recent med list.        B-complex with vitamin C tablet Take 1 tablet by mouth daily.   multivitamin with minerals Tabs tablet Take 1 tablet by mouth daily.   multivitamins ther. w/minerals Tabs tablet Take 1 tablet by mouth daily.      Return if symptoms worsen or fail to improve, for schedule well female exam when needed and f/u if not feeling better.

## 2018-11-09 LAB — VITAMIN D 25 HYDROXY (VIT D DEFICIENCY, FRACTURES): Vit D, 25-Hydroxy: 13.6 ng/mL — ABNORMAL LOW (ref 30.0–100.0)

## 2018-11-10 ENCOUNTER — Other Ambulatory Visit: Payer: Self-pay | Admitting: Family Medicine

## 2018-11-10 DIAGNOSIS — E559 Vitamin D deficiency, unspecified: Secondary | ICD-10-CM

## 2018-11-10 MED ORDER — VITAMIN D (ERGOCALCIFEROL) 1.25 MG (50000 UNIT) PO CAPS: 50000.0000 [IU] | ORAL_CAPSULE | ORAL | 4 refills | Status: DC

## 2018-11-10 NOTE — Progress Notes (Signed)
Patient ID: Kelly Arnold, female   DOB: [birth year], 63 y.o.   MRN: [id number]   Patient is status post recent office visit in follow-up of fatigue.  Patient with normal CBC, BMP and TSH.  Vitamin D level was done at her recent visit and is low at 13.  Patient will be notified by medical assistant that a prescription will be sent into her pharmacy for vitamin D supplement and after completing 12 weeks of therapy with vitamin D supplement, it is recommended that patient take 1000 IUs of over-the-counter vitamin D3 daily.

## 2018-11-20 ENCOUNTER — Telehealth: Payer: Self-pay | Admitting: *Deleted

## 2018-11-20 NOTE — Telephone Encounter (Signed)
-----   Message from Cain Saupe, MD sent at 11/10/2018  2:57 PM EST ----- Please notify patient that her Vitamin D level was low at 13.6 with normal of 30 or greater and goal of 50 with normal range being 30-100. A prescription will be sent to her pharmacy for a vitamin D supplement that she should take once per week for 12 weeks and then take otc Vitamin D3 of at least 1,000 IU's daily

## 2018-11-20 NOTE — Telephone Encounter (Signed)
Medical Assistant left message on patient's home and cell voicemail. Voicemail states to give a call back to [place] with [facility] at [phone]. Patient is aware of vitamin d being low and needing to take weekly supplement with a recheck in 3-4 months.

## 2019-10-15 ENCOUNTER — Encounter: Payer: Self-pay | Admitting: Family Medicine

## 2019-10-15 ENCOUNTER — Other Ambulatory Visit: Payer: Self-pay

## 2019-10-15 ENCOUNTER — Ambulatory Visit: Payer: BC Managed Care – PPO | Attending: Family Medicine | Admitting: Family Medicine

## 2019-10-15 VITALS — BP 149/78 | HR 55 | Resp 16 | Wt 181.4 lb

## 2019-10-15 DIAGNOSIS — R030 Elevated blood-pressure reading, without diagnosis of hypertension: Secondary | ICD-10-CM | POA: Diagnosis not present

## 2019-10-15 DIAGNOSIS — Z1211 Encounter for screening for malignant neoplasm of colon: Secondary | ICD-10-CM | POA: Diagnosis not present

## 2019-10-15 DIAGNOSIS — E559 Vitamin D deficiency, unspecified: Secondary | ICD-10-CM | POA: Diagnosis not present

## 2019-10-15 DIAGNOSIS — M25561 Pain in right knee: Secondary | ICD-10-CM

## 2019-10-15 DIAGNOSIS — Z862 Personal history of diseases of the blood and blood-forming organs and certain disorders involving the immune mechanism: Secondary | ICD-10-CM

## 2019-10-15 NOTE — Patient Instructions (Addendum)
Please schedule a 4 week follow up with Kelly Arnold for bp check   Acute Knee Pain, Adult Many things can cause knee pain. Sometimes, knee pain is sudden (acute) and may be caused by damage, swelling, or irritation of the muscles and tissues that support your knee. The pain often goes away on its own with time and rest. If the pain does not go away, tests may be done to find out what is causing the pain. Follow these instructions at home: Pay attention to any changes in your symptoms. Take these actions to relieve your pain. If you have a knee sleeve or brace:   Wear the sleeve or brace as told by your doctor. Remove it only as told by your doctor.  Loosen the sleeve or brace if your toes: ? Tingle. ? Become numb. ? Turn cold and blue.  Keep the sleeve or brace clean.  If the sleeve or brace is not waterproof: ? Do not let it get wet. ? Cover it with a watertight covering when you take a bath or shower. Activity  Rest your knee.  Do not do things that cause pain.  Avoid activities where both feet leave the ground at the same time (high-impact activities). Examples are running, jumping rope, and doing jumping jacks.  Work with a physical therapist to make a safe exercise program, as told by your doctor. Managing pain, stiffness, and swelling   If told, put ice on the knee: ? Put ice in a plastic bag. ? Place a towel between your skin and the bag. ? Leave the ice on for 20 minutes, 2-3 times a day.  If told, put pressure (compression) on your injured knee to control swelling, give support, and help with discomfort. Compression may be done with an elastic bandage. General instructions  Take all medicines only as told by your doctor.  Raise (elevate) your knee while you are sitting or lying down. Make sure your knee is higher than your heart.  Sleep with a pillow under your knee.  Do not use any products that contain nicotine or tobacco. These include cigarettes, e-cigarettes,  and chewing tobacco. These products may slow down healing. If you need help quitting, ask your doctor.  If you are overweight, work with your doctor and a food expert (dietitian) to set goals to lose weight. Being overweight can make your knee hurt more.  Keep all follow-up visits as told by your doctor. This is important. Contact a doctor if:  The knee pain does not stop.  The knee pain changes or gets worse.  You have a fever along with knee pain.  Your knee feels warm when you touch it.  Your knee gives out or locks up. Get help right away if:  Your knee swells, and the swelling gets worse.  You cannot move your knee.  You have very bad knee pain. Summary  Many things can cause knee pain. The pain often goes away on its own with time and rest.  Your doctor may do tests to find out the cause of the pain.  Pay attention to any changes in your symptoms. Relieve your pain with rest, medicines, light activity, and use of ice.  Get help right away if you cannot move your knee or your knee pain is very bad. This information is not intended to replace advice given to you by your health care provider. Make sure you discuss any questions you have with your health care provider. Document Revised: 02/07/2018 Document  Reviewed: 02/07/2018 Elsevier Patient Education  [employer].

## 2019-10-15 NOTE — Progress Notes (Signed)
Established Patient Office Visit  Subjective:  Patient ID: Kelly Arnold, female    DOB: Dec 21, 1955  Age: 64 y.o. MRN: [id number]  CC:  Chief Complaint  Patient presents with  . Knee Pain    HPI Kelly Arnold, 64 year old female, who presents secondary to recent onset of right knee pain.  She does not recall any acute injury but started having onset of pain along the sides of her right knee.  She reports that she had stepped over into her bathtub to take a shower and reached down to pick up something then straightened back up and noticed some discomfort in the right knee but she does not believe that there was any actual injury to the knee as she did not have any acute onset of pain or swelling at that time.  The next day however she started having increased pain in her knee.  Pain is usually not present if she is sitting still but as soon as she attempts to stand up or starts walking then she has increased pain.  Pain can range from a 6-8 on a 0-to-10 scale.  She has not tried any medications by mouth to help with the pain.  She reports that she actually did go to a urgent care and they advised her to try Voltaren gel but after reading the package that the medication could cause an increased risk of GI bleed, she has not used this medication.  She has had a history of anemia requiring a blood transfusion and she was told not to take nonsteroidal anti-inflammatories or other medicines that can increase her risk of bleeding.  She reports that she can no longer go up and down stairs normally.  She has to almost walk sideways taking 1 step at a time otherwise she feels as if her knee will have increased pain or might give away.  She does not feel as if she has had any significant swelling or increased warmth of her knee.          She reports that she is not currently on any prescription medications.  She has actually finished the prescription for vitamin D after being told that her vitamin D level was  low on blood work done last February.  She denies any current issues with abdominal pain-no nausea/vomiting/diarrhea or constipation.  She has had no blood in the stool and no black stools.  She agrees to be referred for a screening colonoscopy.  Past Medical History:  Diagnosis Date  . History of transfusion   . Normocytic anemia     Past Surgical History:  Procedure Laterality Date  . CESAREAN SECTION      Family History  Problem Relation Age of Onset  . Hypertension Neg Hx   . Diabetes Neg Hx   . CAD Neg Hx   . CVA Neg Hx   . Cancer Neg Hx     Social History   Socioeconomic History  . Marital status: Unknown    Spouse name: Not on file  . Number of children: Not on file  . Years of education: Not on file  . Highest education level: Not on file  Occupational History  . Not on file  Tobacco Use  . Smoking status: Never Smoker  . Smokeless tobacco: Never Used  Substance and Sexual Activity  . Alcohol use: No  . Drug use: No  . Sexual activity: Yes  Other Topics Concern  . Not on file  Social History Narrative  .  Not on file   Social Determinants of Health   Financial Resource Strain:   . Difficulty of Paying Living Expenses: Not on file  Food Insecurity:   . Worried About Programme researcher, broadcasting/film/video in the Last Year: Not on file  . Ran Out of Food in the Last Year: Not on file  Transportation Needs:   . Lack of Transportation (Medical): Not on file  . Lack of Transportation (Non-Medical): Not on file  Physical Activity:   . Days of Exercise per Week: Not on file  . Minutes of Exercise per Session: Not on file  Stress:   . Feeling of Stress : Not on file  Social Connections:   . Frequency of Communication with Friends and Family: Not on file  . Frequency of Social Gatherings with Friends and Family: Not on file  . Attends Religious Services: Not on file  . Active Member of Clubs or Organizations: Not on file  . Attends Banker Meetings: Not on file   . Marital Status: Not on file  Intimate Partner Violence:   . Fear of Current or Ex-Partner: Not on file  . Emotionally Abused: Not on file  . Physically Abused: Not on file  . Sexually Abused: Not on file    Outpatient Medications Prior to Visit  Medication Sig Dispense Refill  . B Complex-C (B-COMPLEX WITH VITAMIN C) tablet Take 1 tablet by mouth daily.    . Multiple Vitamin (MULTIVITAMIN WITH MINERALS) TABS Take 1 tablet by mouth daily.    . Multiple Vitamins-Minerals (MULTIVITAMINS THER. W/MINERALS) TABS tablet Take 1 tablet by mouth daily. 30 each 11  . Vitamin D, Ergocalciferol, (DRISDOL) 1.25 MG (50000 UT) CAPS capsule Take 1 capsule (50,000 Units total) by mouth every 7 (seven) days. 5 capsule 4   No facility-administered medications prior to visit.    No Known Allergies  ROS Review of Systems  Constitutional: Negative for chills, fatigue and fever.  HENT: Negative for sore throat and trouble swallowing.   Respiratory: Negative for cough and shortness of breath.   Cardiovascular: Negative for chest pain and palpitations.  Gastrointestinal: Negative for abdominal pain, blood in stool, constipation, diarrhea and nausea.  Endocrine: Negative for polydipsia, polyphagia and polyuria.  Genitourinary: Negative for dysuria and frequency.  Musculoskeletal: Positive for arthralgias and gait problem. Negative for joint swelling.  Neurological: Negative for dizziness and headaches.  Hematological: Negative for adenopathy. Does not bruise/bleed easily.      Objective:    Physical Exam  Constitutional: She is oriented to person, place, and time. She appears well-developed and well-nourished.  Well-nourished well-developed obese older female in no acute distress.  She is wearing a facemask as per office COVID-19 protocol  Neck: No JVD present.  Cardiovascular: Normal rate and regular rhythm.  Pulmonary/Chest: Effort normal and breath sounds normal.  Abdominal: Soft. There is no  abdominal tenderness. There is no rebound and no guarding.  Musculoskeletal:        General: Tenderness present.     Cervical back: Normal range of motion and neck supple.     Comments: Patient with marked crepitation of both knees.  Patient with mild decrease in ability to flex the right knee due to discomfort.  Patient with stable knee joint/negative Lachman's.  She does have joint line tenderness medial greater than lateral of the right knee as well as left knee lateral joint line tenderness.  Questionable very mild generalized knee effusion.  Lymphadenopathy:    She has no  cervical adenopathy.  Neurological: She is alert and oriented to person, place, and time.  Skin: Skin is warm and dry.  Psychiatric: She has a normal mood and affect. Her behavior is normal.  Nursing note and vitals reviewed.   BP (!) 159/81   Pulse (!) 55   Resp 16   Wt 181 lb 6.4 oz (82.3 kg)   SpO2 99%   BMI 34.28 kg/m  Wt Readings from Last 3 Encounters:  10/15/19 181 lb 6.4 oz (82.3 kg)  11/08/18 173 lb (78.5 kg)  05/12/12 168 lb 6.9 oz (76.4 kg)     Health Maintenance Due  Topic Date Due  . Hepatitis C Screening  12-May-1956  . HIV Screening  04/19/1973  . TETANUS/TDAP  04/19/1977  . COLONOSCOPY  04/19/2008  . INFLUENZA VACCINE  04/12/2019   Patient declines influenza immunization.  She agrees to have referral for colonoscopy.  She declines tetanus immunization at today's visit.   Lab Results  Component Value Date   TSH 2.500 09/27/2018   Lab Results  Component Value Date   WBC 5.5 09/27/2018   HGB 12.4 09/27/2018   HCT 38.4 09/27/2018   MCV 74 (L) 09/27/2018   PLT 241 09/27/2018   Lab Results  Component Value Date   NA 142 09/27/2018   K 4.6 09/27/2018   CO2 21 09/27/2018   GLUCOSE 89 09/27/2018   BUN 14 09/27/2018   CREATININE 0.65 09/27/2018   BILITOT 0.3 09/27/2018   ALKPHOS 79 09/27/2018   AST 19 09/27/2018   ALT 15 09/27/2018   PROT 7.4 09/27/2018   ALBUMIN 4.5  09/27/2018   CALCIUM 9.9 09/27/2018   No results found for: CHOL No results found for: HDL No results found for: LDLCALC No results found for: TRIG No results found for: CHOLHDL No results found for: ZOXW9U    Assessment & Plan:  1. Acute pain of right knee Patient with recent acute onset of right knee pain without trauma/injury.  Discussed with patient that I suspect that she has osteoarthritis of the right knee.  She was offered referral for further evaluation and treatment with orthopedics which she declines.  She is encouraged to use the over-the-counter Voltaren gel that was suggested when she was seen at urgent care.  Discussed with the patient that since the medication is topical/placed on the skin that it should have lower absorption than oral medication and have a decreased risk of causing GI bleed.  She may also take over-the-counter Tylenol arthritis as needed for pain.  She is encouraged to ice the knee if there is any swelling.  She reports that she is actually been using a warm pack on her knee which sometimes helps her knee feel better.  She would like to have an x-ray of her knee and order was placed for her to obtain the x-ray at her convenience.  She is aware that she can call the office at any time if she does decide that she would like orthopedic referral. - DG Knee 4 Views W/Patella Right; Future  2. Vitamin D deficiency History of vitamin D deficiency and patient with vitamin D level of 13.6 on 11/08/2018.  We will repeat vitamin D level at today's visit and she will be notified if additional prescription versus over-the-counter supplementation is needed.  Due to her age, she should consider taking a daily low-dose vitamin D3 supplement. - Vitamin D, 25-hydroxy  3. History of normocytic normochromic anemia Patient with past history of anemia and  past history of anemia that required transfusion.  She will have CBC at today's visit and follow-up.  She denies any unusual  bruising or bleeding and has had no blood in the stool or black stools. - CBC  4. Screening for colon cancer She agrees to be referred to gastroenterology for screening colonoscopy. - Ambulatory referral to Gastroenterology  5.  Elevated blood pressure Blood pressure was elevated at today's visit.  Low-sodium diet discussed as well as weight loss.  She will return to clinic in approximately 4 weeks for blood pressure recheck with the clinical pharmacist.  An After Visit Summary was printed and given to the patient.  Follow-up: Return in about 6 months (around 04/13/2020) for Knee pain-call if Ortho referral needed; 4 week f/u with [facility].   Antony Blackbird, MD

## 2019-10-16 LAB — CBC
Hematocrit: 38.5 % (ref 34.0–46.6)
Hemoglobin: 12.5 g/dL (ref 11.1–15.9)
MCH: 24.8 pg — ABNORMAL LOW (ref 26.6–33.0)
MCHC: 32.5 g/dL (ref 31.5–35.7)
MCV: 76 fL — ABNORMAL LOW (ref 79–97)
Platelets: 253 x10E3/uL (ref 150–450)
RBC: 5.04 x10E6/uL (ref 3.77–5.28)
RDW: 14.6 % (ref 11.7–15.4)
WBC: 4.9 x10E3/uL (ref 3.4–10.8)

## 2019-10-16 LAB — VITAMIN D 25 HYDROXY (VIT D DEFICIENCY, FRACTURES): Vit D, 25-Hydroxy: 32.6 ng/mL (ref 30.0–100.0)

## 2019-10-21 ENCOUNTER — Ambulatory Visit (HOSPITAL_COMMUNITY)
Admission: RE | Admit: 2019-10-21 | Discharge: 2019-10-21 | Disposition: A | Discharge status: Home / Self Care | Payer: BC Managed Care – PPO | Source: Ambulatory Visit | Attending: Family Medicine | Admitting: Family Medicine

## 2019-10-21 ENCOUNTER — Other Ambulatory Visit: Payer: Self-pay

## 2019-10-21 DIAGNOSIS — M25561 Pain in right knee: Secondary | ICD-10-CM | POA: Insufficient documentation

## 2019-11-12 ENCOUNTER — Other Ambulatory Visit: Payer: Self-pay

## 2019-11-12 ENCOUNTER — Ambulatory Visit: Payer: BC Managed Care – PPO | Attending: Family Medicine | Admitting: Pharmacist

## 2019-11-12 ENCOUNTER — Encounter: Payer: Self-pay | Admitting: Family Medicine

## 2019-11-12 VITALS — BP 123/74 | HR 59

## 2019-11-12 DIAGNOSIS — Z0130 Encounter for examination of blood pressure without abnormal findings: Secondary | ICD-10-CM

## 2019-11-12 DIAGNOSIS — R030 Elevated blood-pressure reading, without diagnosis of hypertension: Secondary | ICD-10-CM

## 2019-11-12 NOTE — Progress Notes (Signed)
Patient ID: Kelly Arnold                 DOB: [birth year]                      MRN: [id number]  HPI: Kelly Arnold is a 64 y.o. female referred by Dr. Jillyn Hidden to HTN clinic due to elevated BP 149/78 at last PCP visit on 10/15/2019. PMH is significant for sinus tachycardia, normocytic anemia and acute knee pain. Patient denies headaches, blurry vision, chest pain and dizziness. Patient is currently not on any BP meds and reports not frequently checking BP at home. Patient reports last home BP reading of SBP 129, DBP unknown. Today, clinic BP is normal at 123/74 mmHg. Diet consist of green tea and fruits in the morning, smoothies in the afternoon, and rice, beans, and vegetables for dinner. Patient reports "sometimes" walking around the neighborhood.   Current HTN meds: none Previously tried: none BP goal: <120/80 mmHg  Social History: never smoker, denies alcohol and illicit drug use.  Diet: green tea, fruits, smoothie, rice, beans, vegetables  Exercise: trying to walk more around the neighborhood.   Home BP readings: SBP 129  Wt Readings from Last 3 Encounters:  10/15/19 181 lb 6.4 oz (82.3 kg)  11/08/18 173 lb (78.5 kg)  05/12/12 168 lb 6.9 oz (76.4 kg)   BP Readings from Last 3 Encounters:  11/12/19 123/74  10/15/19 (!) 149/78  11/08/18 133/72   Pulse Readings from Last 3 Encounters:  11/12/19 (!) 59  10/15/19 (!) 55  11/08/18 65    Renal function: CrCl cannot be calculated (Patient's most recent lab result is older than the maximum 21 days allowed.).  Past Medical History:  Diagnosis Date  . History of transfusion   . Normocytic anemia     Current Outpatient Medications on File Prior to Visit  Medication Sig Dispense Refill  . B Complex-C (B-COMPLEX WITH VITAMIN C) tablet Take 1 tablet by mouth daily.    . Multiple Vitamin (MULTIVITAMIN WITH MINERALS) TABS Take 1 tablet by mouth daily.    . Multiple Vitamins-Minerals (MULTIVITAMINS THER. W/MINERALS) TABS tablet Take 1  tablet by mouth daily. 30 each 11   No current facility-administered medications on file prior to visit.    No Known Allergies   Assessment/Plan:  1. Elevated Blood Pressure - BP normal at 123/74 mmHg and not taking any BP meds. Recommend no med changes at this time. Encourage patient to check BP at least once daily, and discussed the importance of limiting salt, sugar, and fat intake and exercising 150 mins per week (at least 30 mins/day). Patient verbalized understanding. Patient will follow-up with Pharmacist Clinic in 1 month for BP check.  Written patient instructions provided.  Total time in face to face counseling 15 minutes.   Follow up with Pharmacist Clinic Visit in 1 month.     Patient seen with:  Fabio Neighbors, PharmD PGY1 Ambulatory Care Resident [facility]  Butch Penny, PharmD, CPP Clinical Pharmacist [facility] [phone]

## 2019-11-25 ENCOUNTER — Telehealth: Payer: Self-pay | Admitting: Family Medicine

## 2019-11-25 NOTE — Telephone Encounter (Signed)
Patient states at last visit she discussed Knee pain and PCP informed her to call if she needed referral. Please call her

## 2019-11-25 NOTE — Telephone Encounter (Signed)
Tania can you please, forwarder to her pcp  .  I don't see a referral placed  Thank you .

## 2019-11-26 ENCOUNTER — Other Ambulatory Visit: Payer: Self-pay | Admitting: Family Medicine

## 2019-11-26 DIAGNOSIS — G8929 Other chronic pain: Secondary | ICD-10-CM

## 2019-11-26 NOTE — Telephone Encounter (Signed)
Orthopedic referral placed

## 2019-11-26 NOTE — Progress Notes (Signed)
Patient ID: Kelly Arnold, female   DOB: 09/09/1956, 64 y.o.   MRN: [id number]   At her last visit, patient had a complaint of acute right knee pain however she did not wish to be referred to orthopedics at that time but has since called the office regarding the need for referral due to continued knee pain.  Orthopedic referral will be placed.

## 2019-11-27 NOTE — Telephone Encounter (Signed)
Good Morning Dr Jillyn Hidden  I hope you are doing well  Patient schedule  12-03-19 @ 1:30pm  With [facility]   Thank You .

## 2019-11-27 NOTE — Telephone Encounter (Signed)
Thank you :)

## 2019-12-03 ENCOUNTER — Encounter: Payer: Self-pay | Admitting: Orthopedic Surgery

## 2019-12-03 ENCOUNTER — Other Ambulatory Visit: Payer: Self-pay

## 2019-12-03 ENCOUNTER — Ambulatory Visit (INDEPENDENT_AMBULATORY_CARE_PROVIDER_SITE_OTHER): Payer: BC Managed Care – PPO | Admitting: Orthopedic Surgery

## 2019-12-03 DIAGNOSIS — M25561 Pain in right knee: Secondary | ICD-10-CM

## 2019-12-04 ENCOUNTER — Encounter: Payer: Self-pay | Admitting: Orthopedic Surgery

## 2019-12-04 NOTE — Progress Notes (Signed)
Office Visit Note   Patient: Kelly Arnold           Date of Birth: [birth year]           MRN: [id number] Visit Date: 12/03/2019 Requested by: Antony Blackbird, MD [place],  [place] [id number] PCP: Antony Blackbird, MD  Subjective: Chief Complaint  Patient presents with  . Right Knee - Pain    HPI: [name] is a patient with bilateral knee pain right worse than left.  Started 3 months ago when she was in the tub.  She sustained a hyperflexion injury at that time and reports medial sided knee pain since then.  The pain wakes her from sleep at night.  She describes decreased motion and swelling and weakness with grinding.  Radiographs done 224 reviewed and intact with no arthritis no loose bodies or fracture.  Hard for her to flex the knee at times because it feels like there is "a bubble in there".  Denies any instability.  Takes Voltaren which has not helped.  She does Scientist, physiological.  She actually is sitting more than standing at this time.              ROS: All systems reviewed are negative as they relate to the chief complaint within the history of present illness.  Patient denies  fevers or chills.   Assessment & Plan: Visit Diagnoses:  1. Right knee pain, unspecified chronicity     Plan: Impression is bilateral knee pain right worse than left.  I think she has a medial meniscal tear.  Effusion is present.  Symptoms going on for 3 months.  Failure of conservative treatment.  We talked about an injection today but she wants to really hold off on that based on poor prior experience with that.  I think her likelihood of meniscal pathology is high based on effusion joint line tenderness and mechanism of injury.  Plan MRI scan right knee evaluate medial meniscal tear with follow-up after that.  Follow-Up Instructions: Return for after MRI.   Orders:  Orders Placed This Encounter  Procedures  . MR Knee Right w/o contrast   No orders of the defined types were placed in this  encounter.     Procedures: No procedures performed   Clinical Data: No additional findings.  Objective: Vital Signs: There were no vitals taken for this visit.  Physical Exam:   Constitutional: Patient appears well-developed HEENT:  Head: Normocephalic Eyes:EOM are normal Neck: Normal range of motion Cardiovascular: Normal rate Pulmonary/chest: Effort normal Neurologic: Patient is alert Skin: Skin is warm Psychiatric: Patient has normal mood and affect    Ortho Exam: Ortho exam demonstrates full active and passive range of motion of the right knee.  Effusion is present.  McMurray compression testing positive for medial compartment pathology.  Collateral cruciate ligaments are stable.  No masses lymphadenopathy or skin changes noted in that right knee region.  Extensor mechanism is intact.  Specialty Comments:  No specialty comments available.  Imaging: No results found.   PMFS History: Patient Active Problem List   Diagnosis Date Noted  . Normocytic anemia 05/11/2012  . Sinus tachycardia 05/11/2012   Past Medical History:  Diagnosis Date  . History of transfusion   . Normocytic anemia     Family History  Problem Relation Age of Onset  . Hypertension Neg Hx   . Diabetes Neg Hx   . CAD Neg Hx   . CVA Neg Hx   . Cancer  Neg Hx     Past Surgical History:  Procedure Laterality Date  . CESAREAN SECTION     Social History   Occupational History  . Not on file  Tobacco Use  . Smoking status: Never Smoker  . Smokeless tobacco: Never Used  Substance and Sexual Activity  . Alcohol use: No  . Drug use: No  . Sexual activity: Yes

## 2019-12-15 ENCOUNTER — Ambulatory Visit: Payer: BC Managed Care – PPO | Admitting: Pharmacist

## 2020-01-21 ENCOUNTER — Other Ambulatory Visit: Payer: Self-pay

## 2020-01-21 ENCOUNTER — Ambulatory Visit: Payer: BC Managed Care – PPO | Admitting: Orthopedic Surgery

## 2020-01-21 DIAGNOSIS — S83241D Other tear of medial meniscus, current injury, right knee, subsequent encounter: Secondary | ICD-10-CM

## 2020-01-24 ENCOUNTER — Encounter: Payer: Self-pay | Admitting: Orthopedic Surgery

## 2020-01-24 NOTE — Progress Notes (Signed)
Office Visit Note   Patient: Kelly Arnold           Date of Birth: 03/27/1956           MRN: [id number] Visit Date: 01/21/2020 Requested by: Cain Saupe, MD [address] [place],  [place] [address] PCP: Cain Saupe, MD  Subjective: Chief Complaint  Patient presents with  . Follow-up    HPI: Kelly Arnold presents for follow-up of MRI scan of her right knee.  She has a partial meniscal root tear on the medial side.  She has recurrent effusion today.  We talked at length about these results.  She is continuing to have pain but is very reluctant to undergo any type of intervention such as taking medication having a shot or having surgery.  Denies much in the way of mechanical symptoms but does report pain and swelling.              ROS: All systems reviewed are negative as they relate to the chief complaint within the history of present illness.  Patient denies  fevers or chills.   Assessment & Plan: Visit Diagnoses:  1. Acute medial meniscus tear of right knee, subsequent encounter     Plan: Impression is meniscal root tear partial versus complete on the right knee.  Went through various clinical scenarios with aspiration and injection of the knee versus meniscal debridement of a partial root tear versus attempted meniscal root repair if the root is completely avulsed.  I think in general there is no intervention skin give her a great knee but we could potentially improve the knee.  She wants to try hinged brace for now.  Follow-up with [place] as needed.  Follow-Up Instructions: Return if symptoms worsen or fail to improve.   Orders:  No orders of the defined types were placed in this encounter.  No orders of the defined types were placed in this encounter.     Procedures: No procedures performed   Clinical Data: No additional findings.  Objective: Vital Signs: There were no vitals taken for this visit.  Physical Exam:   Constitutional: Patient appears  well-developed HEENT:  Head: Normocephalic Eyes:EOM are normal Neck: Normal range of motion Cardiovascular: Normal rate Pulmonary/chest: Effort normal Neurologic: Patient is alert Skin: Skin is warm Psychiatric: Patient has normal mood and affect    Ortho Exam: Ortho exam demonstrates full active and passive range of motion of that right knee with mild effusion collateral cruciate ligaments are stable.  Mild medial joint line tenderness is present.  No masses lymphadenopathy or skin changes noted in that right knee region.  Range of motion is full.  Specialty Comments:  No specialty comments available.  Imaging: No results found.   PMFS History: Patient Active Problem List   Diagnosis Date Noted  . Normocytic anemia 05/11/2012  . Sinus tachycardia 05/11/2012   Past Medical History:  Diagnosis Date  . History of transfusion   . Normocytic anemia     Family History  Problem Relation Age of Onset  . Hypertension Neg Hx   . Diabetes Neg Hx   . CAD Neg Hx   . CVA Neg Hx   . Cancer Neg Hx     Past Surgical History:  Procedure Laterality Date  . CESAREAN SECTION     Social History   Occupational History  . Not on file  Tobacco Use  . Smoking status: Never Smoker  . Smokeless tobacco: Never Used  Substance and Sexual Activity  .  Alcohol use: No  . Drug use: No  . Sexual activity: Yes

## 2020-01-30 ENCOUNTER — Ambulatory Visit
Admission: EM | Admit: 2020-01-30 | Discharge: 2020-01-30 | Disposition: A | Discharge status: Home / Self Care | Payer: BC Managed Care – PPO | Attending: Emergency Medicine | Admitting: Emergency Medicine

## 2020-01-30 ENCOUNTER — Ambulatory Visit (INDEPENDENT_AMBULATORY_CARE_PROVIDER_SITE_OTHER): Payer: BC Managed Care – PPO

## 2020-01-30 DIAGNOSIS — Z23 Encounter for immunization: Secondary | ICD-10-CM | POA: Diagnosis not present

## 2020-01-30 DIAGNOSIS — S61011A Laceration without foreign body of right thumb without damage to nail, initial encounter: Secondary | ICD-10-CM

## 2020-01-30 DIAGNOSIS — M79644 Pain in right finger(s): Secondary | ICD-10-CM

## 2020-01-30 MED ORDER — TETANUS-DIPHTH-ACELL PERTUSSIS 5-2.5-18.5 LF-MCG/0.5 IM SUSP
0.5000 mL | Freq: Once | INTRAMUSCULAR | Status: AC
  Administered 2020-01-30: 0.5 mL via INTRAMUSCULAR

## 2020-01-30 MED ORDER — DOXYCYCLINE HYCLATE 100 MG PO CAPS: 100.0000 mg | ORAL_CAPSULE | Freq: Two times a day (BID) | ORAL | 0 refills | Status: AC

## 2020-01-30 NOTE — Discharge Instructions (Signed)
Keep area(s) clean and dry. Take antibiotic as prescribed with food - important to complete course. Return for worsening pain, redness, swelling, discharge, fever.  Helpful prevention tips: Keep nails short to avoid secondary skin infections.

## 2020-01-30 NOTE — ED Triage Notes (Signed)
Pt c/o fish bone splinter to tip of rt thumb x3 days. Open area to tip of rt thumb noted where pt shaved skin to attempt removal with no success.

## 2020-01-30 NOTE — ED Provider Notes (Addendum)
[facility]    CSN: [id number] Arrival date & time: 01/30/20  1249      History   Chief Complaint Chief Complaint  Patient presents with  . splinter    HPI Kelly Arnold is a 64 y.o. female presenting for splinter to tip of right thumb.  States this occurred 3 weeks ago, though a few days ago tried shaving her skin to remove it.  States foreign body was very fine fishbone.  Denied pain prior to shave attempt, though has had some pain since then.  Unknown tetanus status.  Denies active discharge, fever.   Past Medical History:  Diagnosis Date  . History of transfusion   . Normocytic anemia     Patient Active Problem List   Diagnosis Date Noted  . Normocytic anemia 05/11/2012  . Sinus tachycardia 05/11/2012    Past Surgical History:  Procedure Laterality Date  . CESAREAN SECTION      OB History   No obstetric history on file.      Home Medications    Prior to Admission medications   Medication Sig Start Date End Date Taking? Authorizing Provider  B Complex-C (B-COMPLEX WITH VITAMIN C) tablet Take 1 tablet by mouth daily.    [provider]  doxycycline (VIBRAMYCIN) 100 MG capsule Take 1 capsule (100 mg total) by mouth 2 (two) times daily for 5 days. 01/30/20 02/04/20  Hall-Potvin, [place], PA-C  Multiple Vitamin (MULTIVITAMIN WITH MINERALS) TABS Take 1 tablet by mouth daily.    [provider]  Multiple Vitamins-Minerals (MULTIVITAMINS THER. W/MINERALS) TABS tablet Take 1 tablet by mouth daily. 11/08/18   Antony Blackbird, MD    Family History Family History  Problem Relation Age of Onset  . Hypertension Neg Hx   . Diabetes Neg Hx   . CAD Neg Hx   . CVA Neg Hx   . Cancer Neg Hx     Social History Social History   Tobacco Use  . Smoking status: Never Smoker  . Smokeless tobacco: Never Used  Substance Use Topics  . Alcohol use: No  . Drug use: No     Allergies   Patient has no known allergies.   Review of  Systems As per HPI   Physical Exam Triage Vital Signs ED Triage Vitals  Enc Vitals Group     BP      Pulse      Resp      Temp      Temp src      SpO2      Weight      Height      Head Circumference      Peak Flow      Pain Score      Pain Loc      Pain Edu?      Excl. in [place]?    No data found.  Updated Vital Signs BP (!) 180/104 (BP Location: Left Arm)   Pulse 68   Temp 98.2 F (36.8 C) (Oral)   Resp 16   SpO2 98%   Visual Acuity Right Eye Distance:   Left Eye Distance:   Bilateral Distance:    Right Eye Near:   Left Eye Near:    Bilateral Near:     Physical Exam Constitutional:      General: She is not in acute distress. HENT:     Head: Normocephalic and atraumatic.  Eyes:     General: No scleral icterus.  Pupils: Pupils are equal, round, and reactive to light.  Cardiovascular:     Rate and Rhythm: Normal rate.  Pulmonary:     Effort: Pulmonary effort is normal.  Musculoskeletal:        General: Tenderness present. No swelling. Normal range of motion.     Comments: Right thumb NVI  Skin:    Coloration: Skin is not jaundiced or pale.     Comments: Small, well-healing laceration to distal aspect of right thumb.  No foreign body, active discharge, bleeding.  Patient is tender at distal aspect without warmth.  Neurological:     Mental Status: She is alert and oriented to person, place, and time.      UC Treatments / Results  Labs (all labs ordered are listed, but only abnormal results are displayed) Labs Reviewed - No data to display  EKG   Radiology DG Finger Thumb Right  Result Date: 01/30/2020 CLINICAL DATA:  Fishbone foreign body. Puncture wound to distal first digit for 3 weeks with fishbone. Foreign body sensation. Initial encounter. EXAM: RIGHT THUMB 2+V COMPARISON:  None. FINDINGS: There is no evidence of fracture or dislocation. No visualized radiopaque foreign body. No soft tissue air. Moderate osteoarthritis of the interphalangeal  joint. No periosteal reaction or bony destruction. IMPRESSION: 1. No radiopaque foreign body. No soft tissue air. 2. No acute osseous abnormality. Moderate osteoarthritis of the interphalangeal joint. Electronically Signed   By: Narda Rutherford M.D.   On: 01/30/2020 13:43    Procedures Procedures (including critical care time)  Medications Ordered in UC Medications  Tdap (BOOSTRIX) injection 0.5 mL (0.5 mLs Intramuscular Given 01/30/20 1347)    Initial Impression / Assessment and Plan / UC Course  I have reviewed the triage vital signs and the nursing notes.  Pertinent labs & imaging results that were available during my care of the patient were reviewed by me and considered in my medical decision making (see chart for details).     Patient afebrile, nontoxic in office today.  Patient is hypertensive: Denies history of hypertension, and denies chest pain, difficulty breathing, abdominal pain, headache.  H&P concerning for secondary infection second to shaving skin.  X-ray of right thumb unremarkable.  Reviewed findings with patient verbalized understanding.  Will start doxycycline today and perform wound care which was reviewed at time of visit.  Return precautions discussed, patient verbalized understanding and is agreeable to plan. Final Clinical Impressions(s) / UC Diagnoses   Final diagnoses:  Thumb pain, right  Laceration of right thumb without foreign body without damage to nail, initial encounter     Discharge Instructions     Keep area(s) clean and dry. Take antibiotic as prescribed with food - important to complete course. Return for worsening pain, redness, swelling, discharge, fever.  Helpful prevention tips: Keep nails short to avoid secondary skin infections.    ED Prescriptions    Medication Sig Dispense Auth. Provider   doxycycline (VIBRAMYCIN) 100 MG capsule Take 1 capsule (100 mg total) by mouth 2 (two) times daily for 5 days. 10 capsule Hall-Potvin, [place],  PA-C     PDMP not reviewed this encounter.   Hall-Potvin, [place], PA-C 01/30/20 2105    Hall-Potvin, [place], [place] 01/30/20 2106

## 2020-04-20 ENCOUNTER — Other Ambulatory Visit: Payer: Self-pay

## 2020-04-20 ENCOUNTER — Ambulatory Visit: Payer: BC Managed Care – PPO | Attending: Family Medicine | Admitting: Family Medicine

## 2020-04-20 ENCOUNTER — Encounter: Payer: Self-pay | Admitting: Family Medicine

## 2020-04-20 VITALS — BP 156/81 | HR 58 | Ht 61.0 in | Wt 178.8 lb

## 2020-04-20 DIAGNOSIS — R030 Elevated blood-pressure reading, without diagnosis of hypertension: Secondary | ICD-10-CM

## 2020-04-20 DIAGNOSIS — S60459A Superficial foreign body of unspecified finger, initial encounter: Secondary | ICD-10-CM | POA: Diagnosis not present

## 2020-04-20 NOTE — Progress Notes (Signed)
Subjective:  Patient ID: Kelly Arnold, female    DOB: Mar 18, 1956  Age: 64 y.o. MRN: [id number]  CC: Hand Pain   HPI Kelly Arnold presents today with the following complaint. 3 months ago she had a splinter to the tip of her right thumb after a skin was penetrated by a fishbone.  Was seen at the ED and right thumb x-ray revealed absence of foreign body but presence of right thumb osteoarthritis. She continues to have a throbbing and itching in R thumb especially when she she is using her R hand.  Pain is described as 5-6/10 and has been progressively increasing.  She is right-handed.  Her systolic BP has been in the 120-135 but it is elevated in the clinic today.  She is unsure if this is related to the fact that she recently returned from a trip a couple of days ago but also states she does have whitecoat hypertension. Past Medical History:  Diagnosis Date  . History of transfusion   . Normocytic anemia     Past Surgical History:  Procedure Laterality Date  . CESAREAN SECTION      Family History  Problem Relation Age of Onset  . Hypertension Neg Hx   . Diabetes Neg Hx   . CAD Neg Hx   . CVA Neg Hx   . Cancer Neg Hx     No Known Allergies  Outpatient Medications Prior to Visit  Medication Sig Dispense Refill  . B Complex-C (B-COMPLEX WITH VITAMIN C) tablet Take 1 tablet by mouth daily.    . Multiple Vitamin (MULTIVITAMIN WITH MINERALS) TABS Take 1 tablet by mouth daily.    . Multiple Vitamins-Minerals (MULTIVITAMINS THER. W/MINERALS) TABS tablet Take 1 tablet by mouth daily. 30 each 11   No facility-administered medications prior to visit.     ROS Review of Systems  Constitutional: Negative for activity change, appetite change and fatigue.  HENT: Negative for congestion, sinus pressure and sore throat.   Eyes: Negative for visual disturbance.  Respiratory: Negative for cough, chest tightness, shortness of breath and wheezing.   Cardiovascular: Negative for  chest pain and palpitations.  Gastrointestinal: Negative for abdominal distention, abdominal pain and constipation.  Endocrine: Negative for polydipsia.  Genitourinary: Negative for dysuria and frequency.  Musculoskeletal:       See HPI  Skin: Negative for rash.  Neurological: Negative for tremors, light-headedness and numbness.  Hematological: Does not bruise/bleed easily.  Psychiatric/Behavioral: Negative for agitation and behavioral problems.    Objective:  BP (!) 156/81   Pulse (!) 58   Ht 5\' 1"  (1.549 m)   Wt 178 lb 12.8 oz (81.1 kg)   SpO2 100%   BMI 33.78 kg/m   BP/Weight 04/20/2020 01/30/2020 11/12/2019  Systolic BP 156 180 123  Diastolic BP 81 104 74  Wt. (Lbs) 178.8 - -  BMI 33.78 - -      Physical Exam Constitutional:      Appearance: She is well-developed.  Neck:     Vascular: No JVD.  Cardiovascular:     Rate and Rhythm: Bradycardia present.     Heart sounds: Normal heart sounds. No murmur heard.   Pulmonary:     Effort: Pulmonary effort is normal.     Breath sounds: Normal breath sounds. No wheezing or rales.  Chest:     Chest wall: No tenderness.  Abdominal:     General: Bowel sounds are normal. There is no distension.     Palpations: Abdomen is soft.  There is no mass.     Tenderness: There is no abdominal tenderness.  Musculoskeletal:        General: Normal range of motion.     Right lower leg: No edema.     Left lower leg: No edema.     Comments: Slight tenderness on palpation of pulp of right thumb but no foreign body palpated.  Neurological:     Mental Status: She is alert and oriented to person, place, and time.  Psychiatric:        Mood and Affect: Mood normal.     CMP Latest Ref Rng & Units 09/27/2018 05/12/2012 05/11/2012  Glucose 65 - 99 mg/dL 89 86 366(Y)  BUN 8 - 27 mg/dL 14 5(L) 7  Creatinine 4.03 - 1.00 mg/dL 4.74 2.59 5.63  Sodium 134 - 144 mmol/L 142 139 134(L)  Potassium 3.5 - 5.2 mmol/L 4.6 4.1 3.9  Chloride 96 - 106 mmol/L 105  109 101  CO2 20 - 29 mmol/L 21 24 25   Calcium 8.7 - 10.3 mg/dL 9.9 8.5 9.1  Total Protein 6.0 - 8.5 g/dL 7.4 6.6 7.8  Total Bilirubin 0.0 - 1.2 mg/dL 0.3 0.8 0.9  Alkaline Phos 39 - 117 IU/L 79 135(H) 177(H)  AST 0 - 40 IU/L 19 42(H) 51(H)  ALT 0 - 32 IU/L 15 29 34    Lipid Panel  No results found for: CHOL, TRIG, HDL, CHOLHDL, VLDL, LDLCALC, LDLDIRECT  CBC    Component Value Date/Time   WBC 4.9 10/15/2019 1443   WBC 8.0 05/12/2012 0615   RBC 5.04 10/15/2019 1443   RBC 2.99 (L) 05/12/2012 0615   HGB 12.5 10/15/2019 1443   HCT 38.5 10/15/2019 1443   PLT 253 10/15/2019 1443   MCV 76 (L) 10/15/2019 1443   MCH 24.8 (L) 10/15/2019 1443   MCH 24.4 (L) 05/12/2012 0615   MCHC 32.5 10/15/2019 1443   MCHC 31.2 05/12/2012 0615   RDW 14.6 10/15/2019 1443   LYMPHSABS 2.7 09/27/2018 1659   MONOABS 1.8 (H) 05/11/2012 0910   EOSABS 0.1 09/27/2018 1659   BASOSABS 0.0 09/27/2018 1659    No results found for: HGBA1C  Assessment & Plan:  1. Splinter of finger Unable to palpate foreign body but she still has ongoing tenderness We will proceed with thumb x-ray Consider referral to hand specialist if symptoms persist - DG Finger Thumb Right; Future  2. Elevated blood-pressure reading without diagnosis of hypertension Ambulatory blood pressures have been controlled ?  Whitecoat hypertension Discussed lifestyle modification DASH diet Advised that in the near future she might need to get an antihypertensive   Follow-up: Return in about 3 months (around 07/21/2020) for elevated blood pressure with PCP.       13/06/2020, MD, FAAFP. [facility] [place], [place]   04/20/2020, 4:50 PM

## 2020-04-20 NOTE — Progress Notes (Signed)
Patient has been seen in ED for pain in right thumb on 01/30/20.  Patient states that she is still having throbbing pain in right thumb.

## 2020-04-20 NOTE — Patient Instructions (Signed)
DASH Eating Plan DASH stands for "Dietary Approaches to Stop Hypertension." The DASH eating plan is a healthy eating plan that has been shown to reduce high blood pressure (hypertension). It may also reduce your risk for type 2 diabetes, heart disease, and stroke. The DASH eating plan may also help with weight loss. What are tips for following this plan?  General guidelines  Avoid eating more than 2,300 mg (milligrams) of salt (sodium) a day. If you have hypertension, you may need to reduce your sodium intake to 1,500 mg a day.  Limit alcohol intake to no more than 1 drink a day for nonpregnant women and 2 drinks a day for men. One drink equals 12 oz of beer, 5 oz of wine, or 1 oz of hard liquor.  Work with your health care provider to maintain a healthy body weight or to lose weight. Ask what an ideal weight is for you.  Get at least 30 minutes of exercise that causes your heart to beat faster (aerobic exercise) most days of the week. Activities may include walking, swimming, or biking.  Work with your health care provider or diet and nutrition specialist (dietitian) to adjust your eating plan to your individual calorie needs. Reading food labels   Check food labels for the amount of sodium per serving. Choose foods with less than 5 percent of the Daily Value of sodium. Generally, foods with less than 300 mg of sodium per serving fit into this eating plan.  To find whole grains, look for the word "whole" as the first word in the ingredient list. Shopping  Buy products labeled as "low-sodium" or "no salt added."  Buy fresh foods. Avoid canned foods and premade or frozen meals. Cooking  Avoid adding salt when cooking. Use salt-free seasonings or herbs instead of table salt or sea salt. Check with your health care provider or pharmacist before using salt substitutes.  Do not fry foods. Cook foods using healthy methods such as baking, boiling, grilling, and broiling instead.  Cook with  heart-healthy oils, such as olive, canola, soybean, or sunflower oil. Meal planning  Eat a balanced diet that includes: ? 5 or more servings of fruits and vegetables each day. At each meal, try to fill half of your plate with fruits and vegetables. ? Up to 6-8 servings of whole grains each day. ? Less than 6 oz of lean meat, poultry, or fish each day. A 3-oz serving of meat is about the same size as a deck of cards. One egg equals 1 oz. ? 2 servings of low-fat dairy each day. ? A serving of nuts, seeds, or beans 5 times each week. ? Heart-healthy fats. Healthy fats called Omega-3 fatty acids are found in foods such as flaxseeds and coldwater fish, like sardines, salmon, and mackerel.  Limit how much you eat of the following: ? Canned or prepackaged foods. ? Food that is high in trans fat, such as fried foods. ? Food that is high in saturated fat, such as fatty meat. ? Sweets, desserts, sugary drinks, and other foods with added sugar. ? Full-fat dairy products.  Do not salt foods before eating.  Try to eat at least 2 vegetarian meals each week.  Eat more home-cooked food and less restaurant, buffet, and fast food.  When eating at a restaurant, ask that your food be prepared with less salt or no salt, if possible. What foods are recommended? The items listed may not be a complete list. Talk with your dietitian about   what dietary choices are best for you. Grains Whole-grain or whole-wheat bread. Whole-grain or whole-wheat pasta. Brown rice. Oatmeal. Quinoa. Bulgur. Whole-grain and low-sodium cereals. Pita bread. Low-fat, low-sodium crackers. Whole-wheat flour tortillas. Vegetables Fresh or frozen vegetables (raw, steamed, roasted, or grilled). Low-sodium or reduced-sodium tomato and vegetable juice. Low-sodium or reduced-sodium tomato sauce and tomato paste. Low-sodium or reduced-sodium canned vegetables. Fruits All fresh, dried, or frozen fruit. Canned fruit in natural juice (without  added sugar). Meat and other protein foods Skinless chicken or turkey. Ground chicken or turkey. Pork with fat trimmed off. Fish and seafood. Egg whites. Dried beans, peas, or lentils. Unsalted nuts, nut butters, and seeds. Unsalted canned beans. Lean cuts of beef with fat trimmed off. Low-sodium, lean deli meat. Dairy Low-fat (1%) or fat-free (skim) milk. Fat-free, low-fat, or reduced-fat cheeses. Nonfat, low-sodium ricotta or cottage cheese. Low-fat or nonfat yogurt. Low-fat, low-sodium cheese. Fats and oils Soft margarine without trans fats. Vegetable oil. Low-fat, reduced-fat, or light mayonnaise and salad dressings (reduced-sodium). Canola, safflower, olive, soybean, and sunflower oils. Avocado. Seasoning and other foods Herbs. Spices. Seasoning mixes without salt. Unsalted popcorn and pretzels. Fat-free sweets. What foods are not recommended? The items listed may not be a complete list. Talk with your dietitian about what dietary choices are best for you. Grains Baked goods made with fat, such as croissants, muffins, or some breads. Dry pasta or rice meal packs. Vegetables Creamed or fried vegetables. Vegetables in a cheese sauce. Regular canned vegetables (not low-sodium or reduced-sodium). Regular canned tomato sauce and paste (not low-sodium or reduced-sodium). Regular tomato and vegetable juice (not low-sodium or reduced-sodium). Pickles. Olives. Fruits Canned fruit in a light or heavy syrup. Fried fruit. Fruit in cream or butter sauce. Meat and other protein foods Fatty cuts of meat. Ribs. Fried meat. Bacon. Sausage. Bologna and other processed lunch meats. Salami. Fatback. Hotdogs. Bratwurst. Salted nuts and seeds. Canned beans with added salt. Canned or smoked fish. Whole eggs or egg yolks. Chicken or turkey with skin. Dairy Whole or 2% milk, cream, and half-and-half. Whole or full-fat cream cheese. Whole-fat or sweetened yogurt. Full-fat cheese. Nondairy creamers. Whipped toppings.  Processed cheese and cheese spreads. Fats and oils Butter. Stick margarine. Lard. Shortening. Ghee. Bacon fat. Tropical oils, such as coconut, palm kernel, or palm oil. Seasoning and other foods Salted popcorn and pretzels. Onion salt, garlic salt, seasoned salt, table salt, and sea salt. Worcestershire sauce. Tartar sauce. Barbecue sauce. Teriyaki sauce. Soy sauce, including reduced-sodium. Steak sauce. Canned and packaged gravies. Fish sauce. Oyster sauce. Cocktail sauce. Horseradish that you find on the shelf. Ketchup. Mustard. Meat flavorings and tenderizers. Bouillon cubes. Hot sauce and Tabasco sauce. Premade or packaged marinades. Premade or packaged taco seasonings. Relishes. Regular salad dressings. Where to find more information:  National Heart, Lung, and Blood Institute: www.nhlbi.nih.gov  American Heart Association: www.heart.org Summary  The DASH eating plan is a healthy eating plan that has been shown to reduce high blood pressure (hypertension). It may also reduce your risk for type 2 diabetes, heart disease, and stroke.  With the DASH eating plan, you should limit salt (sodium) intake to 2,300 mg a day. If you have hypertension, you may need to reduce your sodium intake to 1,500 mg a day.  When on the DASH eating plan, aim to eat more fresh fruits and vegetables, whole grains, lean proteins, low-fat dairy, and heart-healthy fats.  Work with your health care provider or diet and nutrition specialist (dietitian) to adjust your eating plan to your   individual calorie needs. This information is not intended to replace advice given to you by your health care provider. Make sure you discuss any questions you have with your health care provider. Document Revised: 08/10/2017 Document Reviewed: 08/21/2016 Elsevier Patient Education  2020 Elsevier Inc.  

## 2020-04-28 ENCOUNTER — Ambulatory Visit (HOSPITAL_COMMUNITY)
Admission: RE | Admit: 2020-04-28 | Discharge: 2020-04-28 | Disposition: A | Discharge status: Home / Self Care | Payer: BC Managed Care – PPO | Source: Ambulatory Visit | Attending: Family Medicine | Admitting: Family Medicine

## 2020-04-28 DIAGNOSIS — S60459A Superficial foreign body of unspecified finger, initial encounter: Secondary | ICD-10-CM | POA: Diagnosis present

## 2020-07-09 ENCOUNTER — Encounter: Payer: Self-pay | Admitting: Internal Medicine

## 2020-07-21 ENCOUNTER — Ambulatory Visit: Payer: BC Managed Care – PPO | Admitting: Family Medicine

## 2020-08-20 ENCOUNTER — Other Ambulatory Visit: Payer: Self-pay

## 2020-08-20 ENCOUNTER — Ambulatory Visit (AMBULATORY_SURGERY_CENTER): Payer: Self-pay | Admitting: *Deleted

## 2020-08-20 VITALS — Ht 61.0 in | Wt 180.0 lb

## 2020-08-20 DIAGNOSIS — Z1211 Encounter for screening for malignant neoplasm of colon: Secondary | ICD-10-CM

## 2020-08-20 NOTE — Progress Notes (Signed)
Patient is here in-person for PV. Patient denies any allergies to eggs or soy. Patient denies any problems with anesthesia/sedation. Patient denies any oxygen use at home. Patient denies taking any diet/weight loss medications or blood thinners. Patient is not being treated for MRSA or C-diff. Patient is aware of our care-partner policy and Covid-19 safety protocol. EMMI education assigned to the patient for the procedure, sent to MyChart. Patient had questions about colonoscopy-offered OV to see Dr.Gessner, pt declined.  COVID-19 vaccines completed on 03/2020 x2, per patient.

## 2020-08-26 ENCOUNTER — Encounter: Payer: Self-pay | Admitting: Internal Medicine

## 2020-09-02 ENCOUNTER — Encounter: Payer: Self-pay | Admitting: Internal Medicine

## 2020-09-02 ENCOUNTER — Other Ambulatory Visit: Payer: Self-pay

## 2020-09-02 ENCOUNTER — Ambulatory Visit (AMBULATORY_SURGERY_CENTER): Payer: BC Managed Care – PPO | Admitting: Internal Medicine

## 2020-09-02 VITALS — BP 149/79 | HR 55 | Temp 97.1°F | Resp 13 | Ht 61.0 in | Wt 180.0 lb

## 2020-09-02 DIAGNOSIS — Z1211 Encounter for screening for malignant neoplasm of colon: Secondary | ICD-10-CM | POA: Diagnosis not present

## 2020-09-02 MED ORDER — SODIUM CHLORIDE 0.9 % IV SOLN: 500.0000 mL | INTRAVENOUS | Status: DC

## 2020-09-02 NOTE — Progress Notes (Signed)
Report given to PACU, vss 

## 2020-09-02 NOTE — Progress Notes (Signed)
Pt's states no medical or surgical changes since previsit or office visit. 

## 2020-09-02 NOTE — Patient Instructions (Addendum)
No polyps or cancer seen toiday.  Next routine colonoscopy or other screening test in 10 years - 2031  I appreciate the opportunity to care for you. Iva Boop, MD, FACG   YOU HAD AN ENDOSCOPIC PROCEDURE TODAY AT THE [facility]:   Refer to the procedure report that was given to you for any specific questions about what was found during the examination.  If the procedure report does not answer your questions, please call your gastroenterologist to clarify.  If you requested that your care partner not be given the details of your procedure findings, then the procedure report has been included in a sealed envelope for you to review at your convenience later.  YOU SHOULD EXPECT: Some feelings of bloating in the abdomen. Passage of more gas than usual.  Walking can help get rid of the air that was put into your GI tract during the procedure and reduce the bloating. If you had a lower endoscopy (such as a colonoscopy or flexible sigmoidoscopy) you may notice spotting of blood in your stool or on the toilet paper. If you underwent a bowel prep for your procedure, you may not have a normal bowel movement for a few days.  Please Note:  You might notice some irritation and congestion in your nose or some drainage.  This is from the oxygen used during your procedure.  There is no need for concern and it should clear up in a day or so.  SYMPTOMS TO REPORT IMMEDIATELY:   Following lower endoscopy (colonoscopy or flexible sigmoidoscopy):  Excessive amounts of blood in the stool  Significant tenderness or worsening of abdominal pains  Swelling of the abdomen that is new, acute  Fever of 100F or higher  For urgent or emergent issues, a gastroenterologist can be reached at any hour by calling [phone]. Do not use MyChart messaging for urgent concerns.    DIET:  We do recommend a small meal at first, but then you may proceed to your regular diet.  Drink plenty of fluids but you  should avoid alcoholic beverages for 24 hours.  ACTIVITY:  You should plan to take it easy for the rest of today and you should NOT DRIVE or use heavy machinery until tomorrow (because of the sedation medicines used during the test).    FOLLOW UP: Our staff will call the number listed on your records 48-72 hours following your procedure to check on you and address any questions or concerns that you may have regarding the information given to you following your procedure. If we do not reach you, we will leave a message.  We will attempt to reach you two times.  During this call, we will ask if you have developed any symptoms of COVID 19. If you develop any symptoms (ie: fever, flu-like symptoms, shortness of breath, cough etc.) before then, please call [phone].  If you test positive for Covid 19 in the 2 weeks post procedure, please call and report this information to [place].    If any biopsies were taken you will be contacted by phone or by letter within the next 1-3 weeks.  Please call us at [phone] if you have not heard about the biopsies in 3 weeks.    SIGNATURES/CONFIDENTIALITY: You and/or your care partner have signed paperwork which will be entered into your electronic medical record.  These signatures attest to the fact that that the information above on your After Visit Summary has been reviewed and is understood.  Full responsibility of the confidentiality of this discharge information lies with you and/or your care-partner.

## 2020-09-02 NOTE — Op Note (Signed)
[facility] Patient Name: Kelly Arnold Procedure Date: 09/02/2020 11:39 AM MRN: [id number] Endoscopist: Iva Boop , MD Age: 64 Referring MD:  Date of Birth: August 19, 1956 Gender: Female Account #: [id number] Procedure:                Colonoscopy Indications:              Screening for colorectal malignant neoplasm, This                            is the patient's first colonoscopy Medicines:                Propofol per Anesthesia, Monitored Anesthesia Care Procedure:                Pre-Anesthesia Assessment:                           - Prior to the procedure, a History and Physical                            was performed, and patient medications and                            allergies were reviewed. The patient's tolerance of                            previous anesthesia was also reviewed. The risks                            and benefits of the procedure and the sedation                            options and risks were discussed with the patient.                            All questions were answered, and informed consent                            was obtained. Prior Anticoagulants: The patient has                            taken no previous anticoagulant or antiplatelet                            agents. ASA Grade Assessment: I - A normal, healthy                            patient. After reviewing the risks and benefits,                            the patient was deemed in satisfactory condition to                            undergo the procedure.  After obtaining informed consent, the colonoscope                            was passed under direct vision. Throughout the                            procedure, the patient's blood pressure, pulse, and                            oxygen saturations were monitored continuously. The                            Olympus CF-HQ190L [id number]) Colonoscope was                            introduced  through the anus and advanced to the the                            cecum, identified by appendiceal orifice and                            ileocecal valve. The colonoscopy was performed with                            moderate difficulty due to a redundant colon and                            significant looping. Successful completion of the                            procedure was aided by straightening and shortening                            the scope to obtain bowel loop reduction and                            applying abdominal pressure. The patient tolerated                            the procedure well. The quality of the bowel                            preparation was excellent. The bowel preparation                            used was Miralax via split dose instruction. Scope In: 11:58:31 AM Scope Out: 12:19:22 PM Scope Withdrawal Time: 0 hours 9 minutes 14 seconds  Total Procedure Duration: 0 hours 20 minutes 51 seconds  Findings:                 The perianal and digital rectal examinations were                            normal.  The sigmoid colon was redundant.                           The exam was otherwise without abnormality on                            direct and retroflexion views. Complications:            No immediate complications. Estimated Blood Loss:     Estimated blood loss: none. Impression:               - Redundant colon.                           - The examination was otherwise normal on direct                            and retroflexion views.                           - No specimens collected. Recommendation:           - Patient has a contact number available for                            emergencies. The signs and symptoms of potential                            delayed complications were discussed with the                            patient. Return to normal activities tomorrow.                            Written discharge  instructions were provided to the                            patient.                           - Resume previous diet.                           - Continue present medications.                           - Repeat colonoscopy in 10 years for screening                            purposes. Iva Boop, MD 09/02/2020 12:26:31 PM This report has been signed electronically.

## 2020-10-08 ENCOUNTER — Encounter: Payer: Self-pay | Admitting: Emergency Medicine

## 2020-10-08 ENCOUNTER — Ambulatory Visit
Admission: EM | Admit: 2020-10-08 | Discharge: 2020-10-08 | Disposition: A | Discharge status: Home / Self Care | Payer: BC Managed Care – PPO

## 2020-10-08 ENCOUNTER — Other Ambulatory Visit: Payer: Self-pay

## 2020-10-08 DIAGNOSIS — J029 Acute pharyngitis, unspecified: Secondary | ICD-10-CM | POA: Diagnosis not present

## 2020-10-08 DIAGNOSIS — Z20822 Contact with and (suspected) exposure to covid-19: Secondary | ICD-10-CM

## 2020-10-08 MED ORDER — FAMOTIDINE 40 MG PO TABS: 40.0000 mg | ORAL_TABLET | Freq: Every day | ORAL | 0 refills | Status: DC

## 2020-10-08 MED ORDER — BENZONATATE 100 MG PO CAPS: 100.0000 mg | ORAL_CAPSULE | Freq: Three times a day (TID) | ORAL | 0 refills | Status: DC

## 2020-10-08 NOTE — Discharge Instructions (Addendum)

## 2020-10-08 NOTE — ED Triage Notes (Signed)
Pt states that she has a sore throat and cough (w/ specs of blood), pt states that her sx started yesterday

## 2020-10-08 NOTE — ED Provider Notes (Signed)
[facility]    CSN: [id number] Arrival date & time: 10/08/20  1433      History   Chief Complaint Chief Complaint  Patient presents with  . Sore Throat  . Cough    HPI Kelly Arnold is a 65 y.o. female  History was provided by the patient. Kelly Arnold is a 65 y.o. female who presents for evaluation of a sore throat. Associated symptoms include dry cough, post nasal drip and sore throat. Onset of symptoms was 1 day ago, gradually worsening since that time.  She is drinking plenty of fluids. She has not had recent close exposure to someone with proven streptococcal pharyngitis. The following portions of the patient's history were reviewed and updated as appropriate: allergies, current medications, past family history, past medical history, past social history, past surgical history and problem list.     Past Medical History:  Diagnosis Date  . History of transfusion 2013  . Normocytic anemia     Patient Active Problem List   Diagnosis Date Noted  . Normocytic anemia 05/11/2012  . Sinus tachycardia 05/11/2012    Past Surgical History:  Procedure Laterality Date  . CESAREAN SECTION     x2    OB History   No obstetric history on file.      Home Medications    Prior to Admission medications   Medication Sig Start Date End Date Taking? Authorizing Provider  benzonatate (TESSALON) 100 MG capsule Take 1 capsule (100 mg total) by mouth every 8 (eight) hours. 10/08/20  Yes Hall-Potvin, [place], PA-C  famotidine (PEPCID) 40 MG tablet Take 1 tablet (40 mg total) by mouth daily. 10/08/20  Yes Hall-Potvin, [place], PA-C  Ascorbic Acid (VITAMIN C PO) Take by mouth.    [provider]  B Complex-C (B-COMPLEX WITH VITAMIN C) tablet Take 1 tablet by mouth daily.    [provider]  Multiple Vitamin (MULTIVITAMIN WITH MINERALS) TABS Take 1 tablet by mouth daily.    [provider]  VITAMIN D PO Take by mouth.    [provider]    Family History Family History  Problem Relation Age of Onset  . Hypertension Neg Hx   . Diabetes Neg Hx   . CAD Neg Hx   . CVA Neg Hx   . Cancer Neg Hx   . Colon cancer Neg Hx   . Colon polyps Neg Hx   . Esophageal cancer Neg Hx   . Rectal cancer Neg Hx   . Stomach cancer Neg Hx     Social History Social History   Tobacco Use  . Smoking status: Never Smoker  . Smokeless tobacco: Never Used  Vaping Use  . Vaping Use: Never used  Substance Use Topics  . Alcohol use: No  . Drug use: No     Allergies   Patient has no known allergies.   Review of Systems Review of Systems  Constitutional: Negative for fatigue and fever.  HENT: Positive for postnasal drip and sore throat. Negative for congestion, dental problem, ear pain, facial swelling, hearing loss, sinus pain, trouble swallowing and voice change.   Eyes: Negative for photophobia, pain and visual disturbance.  Respiratory: Positive for cough. Negative for shortness of breath.   Cardiovascular: Negative for chest pain and palpitations.  Gastrointestinal: Negative for diarrhea and vomiting.  Musculoskeletal: Negative for arthralgias and myalgias.  Neurological: Negative for dizziness and headaches.     Physical Exam Triage Vital Signs ED Triage Vitals  Enc Vitals  Group     BP 10/08/20 1646 (!) 161/95     Pulse Rate 10/08/20 1646 90     Resp 10/08/20 1646 18     Temp 10/08/20 1646 100.3 F (37.9 C)     Temp Source 10/08/20 1646 Oral     SpO2 10/08/20 1646 97 %     Weight --      Height --      Head Circumference --      Peak Flow --      Pain Score 10/08/20 1645 6     Pain Loc --      Pain Edu? --      Excl. in GC? --    No data found.  Updated Vital Signs BP (!) 161/95 (BP Location: Left Arm)   Pulse 90   Temp 100.3 F (37.9 C) (Oral)   Resp 18   SpO2 97%   Visual Acuity Right Eye Distance:   Left Eye Distance:   Bilateral Distance:    Right Eye Near:   Left Eye Near:     Bilateral Near:     Physical Exam Constitutional:      General: She is not in acute distress.    Appearance: She is not ill-appearing or diaphoretic.  HENT:     Head: Normocephalic and atraumatic.     Right Ear: Tympanic membrane and ear canal normal.     Left Ear: Tympanic membrane and ear canal normal.     Mouth/Throat:     Mouth: Mucous membranes are moist.     Pharynx: Oropharynx is clear. No oropharyngeal exudate or posterior oropharyngeal erythema.  Eyes:     General: No scleral icterus.    Conjunctiva/sclera: Conjunctivae normal.     Pupils: Pupils are equal, round, and reactive to light.  Neck:     Comments: Trachea midline, negative JVD Cardiovascular:     Rate and Rhythm: Normal rate and regular rhythm.     Heart sounds: No murmur heard. No gallop.   Pulmonary:     Effort: Pulmonary effort is normal. No respiratory distress.     Breath sounds: No wheezing, rhonchi or rales.  Musculoskeletal:     Cervical back: Neck supple. No tenderness.  Lymphadenopathy:     Cervical: No cervical adenopathy.  Skin:    Capillary Refill: Capillary refill takes less than 2 seconds.     Coloration: Skin is not jaundiced or pale.     Findings: No rash.  Neurological:     General: No focal deficit present.     Mental Status: She is alert and oriented to person, place, and time.      UC Treatments / Results  Labs (all labs ordered are listed, but only abnormal results are displayed) Labs Reviewed  NOVEL CORONAVIRUS, NAA    EKG   Radiology No results found.  Procedures Procedures (including critical care time)  Medications Ordered in UC Medications - No data to display  Initial Impression / Assessment and Plan / UC Course  I have reviewed the triage vital signs and the nursing notes.  Pertinent labs & imaging results that were available during my care of the patient were reviewed by me and considered in my medical decision making (see chart for details).      Patient afebrile, nontoxic, with SpO2 97%.  Covid PCR pending.  Patient to quarantine until results are back.  We will treat supportively as outlined below.  Return precautions discussed, patient verbalized understanding and is  agreeable to plan. Final Clinical Impressions(s) / UC Diagnoses   Final diagnoses:  Encounter for screening laboratory testing for COVID-19 virus  Sore throat     Discharge Instructions     Tessalon for cough. Start flonase, atrovent nasal spray for nasal congestion/drainage. You can use over the counter nasal saline rinse such as neti pot for nasal congestion. Keep hydrated, your urine should be clear to pale yellow in color. Tylenol/motrin for fever and pain. Monitor for any worsening of symptoms, chest pain, shortness of breath, wheezing, swelling of the throat, go to the emergency department for further evaluation needed.     ED Prescriptions    Medication Sig Dispense Auth. Provider   benzonatate (TESSALON) 100 MG capsule Take 1 capsule (100 mg total) by mouth every 8 (eight) hours. 21 capsule Hall-Potvin, [place], PA-C   famotidine (PEPCID) 40 MG tablet Take 1 tablet (40 mg total) by mouth daily. 30 tablet Hall-Potvin, [place], PA-C     PDMP not reviewed this encounter.   Hall-Potvin, [place], [place] 10/08/20 1800

## 2020-10-09 LAB — NOVEL CORONAVIRUS, NAA: SARS-CoV-2, NAA: DETECTED — AB

## 2020-10-09 LAB — SARS-COV-2, NAA 2 DAY TAT

## 2020-10-10 ENCOUNTER — Telehealth: Payer: Self-pay | Admitting: Nurse Practitioner

## 2020-10-10 NOTE — Telephone Encounter (Signed)
Called patient to discuss Covid symptoms and the use of sotrovimab, a monoclonal antibody infusion, or antiviral medication therapy, for those with mild to moderate Covid symptoms and at a high risk of hospitalization.  Pt is qualified for this infusion at the [facility] due to; Specific high risk criteria : BMI > 25   Message left to call back our hotline [phone].  Nicolasa Ducking, NP

## 2020-10-11 ENCOUNTER — Other Ambulatory Visit: Payer: Self-pay | Admitting: Physician Assistant

## 2020-10-11 DIAGNOSIS — U071 COVID-19: Secondary | ICD-10-CM

## 2020-10-11 MED ORDER — MOLNUPIRAVIR EUA 200MG CAPSULE: 4.0000 | ORAL_CAPSULE | Freq: Two times a day (BID) | ORAL | 0 refills | Status: DC

## 2020-10-11 MED ORDER — MOLNUPIRAVIR EUA 200MG CAPSULE: 4.0000 | ORAL_CAPSULE | Freq: Two times a day (BID) | ORAL | 0 refills | Status: AC

## 2020-10-11 MED FILL — MOLNUPIRAVIR 200 MG CAPS: 200 | 5 days supply | Qty: 40 | Fill #0

## 2020-10-11 NOTE — Progress Notes (Signed)
Outpatient Oral COVID Treatment Note  I connected with Kelly Arnold on 10/11/2020/4:29 PM by telephone and verified that I am speaking with the correct person using two identifiers.  I discussed the limitations, risks, security, and privacy concerns of performing an evaluation and management service by telephone and the availability of in person appointments. I also discussed with the patient that there may be a patient responsible charge related to this service. The patient expressed understanding and agreed to proceed.  Patient location: home Provider location: office  Diagnosis: COVID-19 infection  Purpose of visit: Discussion of potential use of Molnupiravir or Paxlovid, a new treatment for mild to moderate COVID-19 viral infection in non-hospitalized patients.   Subjective: Patient is a 65 y.o. female who has been diagnosed with COVID 19 viral infection.  Their symptoms began on 1/27 with body aches and chills.  She is vaccinated but not boosted.    Past Medical History:  Diagnosis Date  . History of transfusion 2013  . Normocytic anemia     No Known Allergies   Current Outpatient Medications:  .  Ascorbic Acid (VITAMIN C PO), Take by mouth., Disp: , Rfl:  .  B Complex-C (B-COMPLEX WITH VITAMIN C) tablet, Take 1 tablet by mouth daily., Disp: , Rfl:  .  benzonatate (TESSALON) 100 MG capsule, Take 1 capsule (100 mg total) by mouth every 8 (eight) hours., Disp: 21 capsule, Rfl: 0 .  famotidine (PEPCID) 40 MG tablet, Take 1 tablet (40 mg total) by mouth daily., Disp: 30 tablet, Rfl: 0 .  Multiple Vitamin (MULTIVITAMIN WITH MINERALS) TABS, Take 1 tablet by mouth daily., Disp: , Rfl:  .  VITAMIN D PO, Take by mouth., Disp: , Rfl:   Objective: Patient sounds stable on the phone.  They are in no apparent distress.  Breathing is non labored.  Mood and behavior are normal.  Laboratory Data:  Recent Results (from the past 2160 hour(s))  Novel Coronavirus, NAA ([facility])     Status:  Abnormal   Collection Time: 10/08/20  4:57 PM   Specimen: Nasopharyngeal Swab; Nasopharyngeal(NP) swabs in vial transport medium   Nasopharynge  Result Value Ref Range   SARS-CoV-2, NAA Detected (A) Not Detected    Comment: Patients who have a positive COVID-19 test result may now have treatment options. Treatment options are available for patients with mild to moderate symptoms and for hospitalized patients. Visit our website at [email or web address] for resources and information. This nucleic acid amplification test was developed and its performance characteristics determined by [employer]. Nucleic acid amplification tests include RT-PCR and TMA. This test has not been FDA cleared or approved. This test has been authorized by FDA under an Emergency Use Authorization (EUA). This test is only authorized for the duration of time the declaration that circumstances exist justifying the authorization of the emergency use of in vitro diagnostic tests for detection of SARS-CoV-2 virus and/or diagnosis of COVID-19 infection under section 564(b)(1) of the Act, 21 U.S.C. 774JOI-7(O) (1), unless the authorization is terminated or revoked sooner. When diagnostic testing is negativ e, the possibility of a false negative result should be considered in the context of a patient's recent exposures and the presence of clinical signs and symptoms consistent with COVID-19. An individual without symptoms of COVID-19 and who is not shedding SARS-CoV-2 virus would expect to have a negative (not detected) result in this assay.   SARS-COV-2, NAA 2 DAY TAT     Status: None   Collection Time: 10/08/20  4:57 PM  Nasopharynge  Result Value Ref Range   SARS-CoV-2, NAA 2 DAY TAT Performed      Assessment: 65 y.o. female with mild/moderate COVID 19 viral infection diagnosed on 1/28 at high risk for progression to severe COVID 19.  Plan:  This patient is a 65 y.o. female that meets the  following criteria for Emergency Use Authorization of: Molnupiravir  1. Age >18 yr 2. SARS-COV-2 positive test 3. Symptom onset < 5 days 4. Mild-to-moderate COVID disease with high risk for severe progression to hospitalization or death   I have spoken and communicated the following to the patient or parent/caregiver regarding: 1. Molnupiravir is an unapproved drug that is authorized for use under an TEFL teacher.  2. There are no adequate, approved, available products for the treatment of COVID-19 in adults who have mild-to-moderate COVID-19 and are at high risk for progressing to severe COVID-19, including hospitalization or death. 3. Other therapeutics are currently authorized. For additional information on all products authorized for treatment or prevention of COVID-19, please see https://[email or web address].graham-[email or web address]/.  4. There are benefits and risks of taking this treatment as outlined in the "Fact Sheet for Patients and Caregivers."  5. "Fact Sheet for Patients and Caregivers" was reviewed with patient. A hard copy will be provided to patient from pharmacy prior to the patient receiving treatment. 6. Patients should continue to self-isolate and use infection control measures (e.g., wear mask, isolate, social distance, avoid sharing personal items, clean and disinfect "high touch" surfaces, and frequent handwashing) according to CDC guidelines.  7. The patient or parent/caregiver has the option to accept or refuse treatment. 8. [employer] has established a pregnancy surveillance program. 9. Females of childbearing potential should use a reliable method of contraception correctly and consistently, as applicable, for the duration of treatment and for 4 days after the last dose of Molnupiravir. 10. Males of reproductive potential who are sexually active with females of childbearing  potential should use a reliable method of contraception correctly and consistently during treatment and for at least 3 months after the last dose. 11. Pregnancy status and risk was assessed. Patient verbalized understanding of precautions.   After reviewing above information with the patient, the patient agrees to receive molnupiravir.  Follow up instructions:    . Take prescription BID x 5 days as directed . Reach out to pharmacist for counseling on medication if desired . For concerns regarding further COVID symptoms please follow up with your PCP or urgent care . For urgent or life-threatening issues, seek care at your local emergency department  The patient was provided an opportunity to ask questions, and all were answered. The patient agreed with the plan and demonstrated an understanding of the instructions.   Script sent to [facility] and opted to pick up RX.  The patient was advised to call their PCP or seek an in-person evaluation if the symptoms worsen or if the condition fails to improve as anticipated.   I provided 15 minutes of non face-to-face telephone visit time during this encounter, and > 50% was spent counseling as documented under my assessment & plan.  Cline Crock, PA-C 10/11/2020 /4:29 PM

## 2020-10-12 NOTE — Progress Notes (Signed)
Sent to wrong pharmacy accidentally. Re prescribed to [facility]

## 2020-12-23 ENCOUNTER — Other Ambulatory Visit: Payer: Self-pay

## 2020-12-23 ENCOUNTER — Ambulatory Visit
Admission: EM | Admit: 2020-12-23 | Discharge: 2020-12-23 | Disposition: A | Discharge status: Home / Self Care | Payer: BC Managed Care – PPO | Attending: Family Medicine | Admitting: Family Medicine

## 2020-12-23 DIAGNOSIS — R42 Dizziness and giddiness: Secondary | ICD-10-CM

## 2020-12-23 DIAGNOSIS — I10 Essential (primary) hypertension: Secondary | ICD-10-CM

## 2020-12-23 DIAGNOSIS — R9431 Abnormal electrocardiogram [ECG] [EKG]: Secondary | ICD-10-CM

## 2020-12-23 DIAGNOSIS — R519 Headache, unspecified: Secondary | ICD-10-CM

## 2020-12-23 LAB — CBC
Hematocrit: 36.4 % (ref 34.0–46.6)
Hemoglobin: 12.3 g/dL (ref 11.1–15.9)
MCH: 24.8 pg — ABNORMAL LOW (ref 26.6–33.0)
MCHC: 33.8 g/dL (ref 31.5–35.7)
MCV: 73 fL — ABNORMAL LOW (ref 79–97)
Platelets: 231 10*3/uL (ref 150–450)
RBC: 4.96 x10E6/uL (ref 3.77–5.28)
RDW: 14.8 % (ref 11.7–15.4)
WBC: 4.4 10*3/uL (ref 3.4–10.8)

## 2020-12-23 LAB — POCT URINALYSIS DIP (MANUAL ENTRY)
Bilirubin, UA: NEGATIVE
Blood, UA: NEGATIVE
Glucose, UA: NEGATIVE mg/dL
Ketones, POC UA: NEGATIVE mg/dL
Nitrite, UA: NEGATIVE
Protein Ur, POC: NEGATIVE mg/dL
Spec Grav, UA: 1.01 (ref 1.010–1.025)
Urobilinogen, UA: 0.2 E.U./dL
pH, UA: 7 (ref 5.0–8.0)

## 2020-12-23 LAB — COMPREHENSIVE METABOLIC PANEL
ALT: 18 IU/L (ref 0–32)
AST: 24 IU/L (ref 0–40)
Albumin/Globulin Ratio: 1.8 (ref 1.2–2.2)
Albumin: 4.5 g/dL (ref 3.8–4.8)
Alkaline Phosphatase: 74 IU/L (ref 44–121)
BUN/Creatinine Ratio: 12 (ref 12–28)
BUN: 8 mg/dL (ref 8–27)
Bilirubin Total: 0.5 mg/dL (ref 0.0–1.2)
CO2: 27 mmol/L (ref 20–29)
Calcium: 9.7 mg/dL (ref 8.7–10.3)
Chloride: 105 mmol/L (ref 96–106)
Creatinine, Ser: 0.65 mg/dL (ref 0.57–1.00)
Globulin, Total: 2.5 g/dL (ref 1.5–4.5)
Glucose: 90 mg/dL (ref 65–99)
Potassium: 5.2 mmol/L (ref 3.5–5.2)
Sodium: 140 mmol/L (ref 134–144)
Total Protein: 7 g/dL (ref 6.0–8.5)
eGFR: 99 mL/min/{1.73_m2} (ref >59)

## 2020-12-23 LAB — POCT FASTING CBG KUC MANUAL ENTRY: POCT Glucose (KUC): 83 mg/dL (ref 70–99)

## 2020-12-23 MED ORDER — AMLODIPINE BESYLATE 2.5 MG PO TABS: 2.5000 mg | ORAL_TABLET | Freq: Every day | ORAL | 0 refills | Status: DC

## 2020-12-23 NOTE — ED Triage Notes (Signed)
Pt c/o headaches since yesterday. States this morning experiencing headaches, dizziness and feeling near syncope. States her b/p readings at home has been elevated. No hx of HTN. Pt c/o being very thirty this morning with excessive urinating.

## 2020-12-23 NOTE — ED Provider Notes (Signed)
[facility]    CSN: [id number] Arrival date & time: 12/23/20  0935      History   Chief Complaint Chief Complaint  Patient presents with  . Headache  . Dizziness    HPI Kelly Arnold is a 65 y.o. female.   HPI  Patient presents for evaluation of headaches and dizziness.  Dizziness occurred x1 episode today and patient reports headache developed sometime during the night when she awakened and has continued to have some mild generalized headache pain.  She denies any associated vision changes, weakness and dizzy occurred while entering her office today at work.  She stated it was a wave of dizziness and she did feel that if she did not sit down that that she could have fallen.  She has been checking her blood pressure with a wrist cuff and reports it has been slightly elevated over the last several days.  [facility] her last visit in February 2021 blood pressure was slightly elevated at that time however provider opted to continue to monitor and not start any medications.  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  Patient has no history of type 2 diabetes or hyperglycemia.  She denies any chest pain.  Patient has a remote history of anemia of unknown etiology requiring a blood transfusion.  Patient denies any rectal or vaginal bleeding.  Past Medical History:  Diagnosis Date  . History of transfusion 2013  . Normocytic anemia     Patient Active Problem List   Diagnosis Date Noted  . Normocytic anemia 05/11/2012  . Sinus tachycardia 05/11/2012    Past Surgical History:  Procedure Laterality Date  . CESAREAN SECTION     x2    OB History   No obstetric history on file.      Home Medications    Prior to Admission medications    Medication Sig Start Date End Date Taking? Authorizing Provider  amLODipine (NORVASC) 2.5 MG tablet Take 1 tablet (2.5 mg total) by mouth daily. 12/23/20  Yes Bing Neighbors, FNP  Ascorbic Acid (VITAMIN C PO) Take by mouth.    [provider]  B Complex-C (B-COMPLEX WITH VITAMIN C) tablet Take 1 tablet by mouth daily.    [provider]  Multiple Vitamin (MULTIVITAMIN WITH MINERALS) TABS Take 1 tablet by mouth daily.    [provider]  VITAMIN D PO Take by mouth.    [provider]    Family History Family History  Problem Relation Age of Onset  . Hypertension Neg Hx   . Diabetes Neg Hx   . CAD Neg Hx   . CVA Neg Hx   . Cancer Neg Hx   . Colon cancer Neg Hx   . Colon polyps Neg Hx   . Esophageal cancer Neg Hx   . Rectal cancer Neg Hx   . Stomach cancer Neg Hx     Social History Social History   Tobacco Use  . Smoking status: Never Smoker  . Smokeless tobacco: Never Used  Vaping Use  . Vaping Use: Never used  Substance Use Topics  . Alcohol use: No  . Drug use: No     Allergies   Patient has no known allergies.   Review of Systems Review of Systems Pertinent negatives  listed in HPI  Physical Exam Triage Vital Signs ED Triage Vitals  Enc Vitals Group     BP 12/23/20 1028 (!) 178/75     Pulse Rate 12/23/20 1028 69     Resp 12/23/20 1028 18     Temp 12/23/20 1028 98.5 F (36.9 C)     Temp Source 12/23/20 1028 Oral     SpO2 12/23/20 1028 97 %     Weight --      Height --      Head Circumference --      Peak Flow --      Pain Score 12/23/20 1029 4     Pain Loc --      Pain Edu? --      Excl. in GC? --    No data found.  Updated Vital Signs BP 138/82 (BP Location: Left Arm)   Pulse 69   Temp 98.5 F (36.9 C) (Oral)   Resp 18   SpO2 97%   Visual Acuity Right Eye Distance:   Left Eye Distance:   Bilateral Distance:    Right Eye Near:   Left Eye Near:    Bilateral Near:     Physical  Exam Constitutional:      General: She is not in acute distress.    Appearance: She is not ill-appearing.  HENT:     Head: Normocephalic.  Eyes:     Extraocular Movements: Extraocular movements intact.     Right eye: Normal extraocular motion and no nystagmus.     Left eye: Normal extraocular motion and no nystagmus.     Pupils: Pupils are equal, round, and reactive to light.  Cardiovascular:     Rate and Rhythm: Bradycardia present.     Pulses:          Radial pulses are 2+ on the right side and 2+ on the left side.  Pulmonary:     Effort: Pulmonary effort is normal.     Breath sounds: Normal breath sounds.  Musculoskeletal:        General: Normal range of motion.     Cervical back: Normal range of motion.     Right lower leg: No edema.     Left lower leg: No edema.  Lymphadenopathy:     Cervical: No cervical adenopathy.  Skin:    General: Skin is warm.  Neurological:     Mental Status: She is alert.     GCS: GCS eye subscore is 4. GCS verbal subscore is 5. GCS motor subscore is 6.  Psychiatric:        Mood and Affect: Mood normal.        Speech: Speech normal.        Behavior: Behavior normal.      UC Treatments / Results  Labs (all labs ordered are listed, but only abnormal results are displayed) Labs Reviewed  POCT URINALYSIS DIP (MANUAL ENTRY) - Abnormal; Notable for the following components:      Result Value   Leukocytes, UA Trace (*)    All other components within normal limits  COMPREHENSIVE METABOLIC PANEL  CBC  POCT FASTING CBG [facility] MANUAL ENTRY    EKG Sinus bradycardia, rate bpm 53, T wave abnormal,    Radiology No results found.  Procedures Procedures (including critical care time)  Medications Ordered in UC Medications - No data to display  Initial Impression / Assessment and Plan / UC Course  I have reviewed the triage vital signs and the  nursing notes.  Pertinent labs & imaging results that were available during my care of the patient  were reviewed by me and considered in my medical decision making (see chart for details).     Patient presents with idiopathic acute onset dizziness and headache x1 day.  Patient has a history of elevated blood pressure however has never been treated for hypertension.  Patient is also orthostatic and blood pressure drops greater than 20 with standing.  Suspect patient experiencing dizziness as she was rapidly moving when this dizziness episode presented.  ECG today showed some abnormal T waves compared with previous ECG from 2013 this is a change in tracing.  Obtaining a CMP to rule out any electrolyte anomaly causing changes in ECG.  Patient is without any acute chest pain and on exam today all findings are grossly intact. Will also check a CBC as patient has a history of anemia of unknown etiology just to ensure patient is hemodynamically stable.  Given history of documented uncontrolled hypertension starting on a very low-dose of amlodipine 2.5 mg once daily.  Patient has a scheduled follow-up with her primary care doctor within 8 days.  Advised patient we will contact her if any of her labs are abnormal after they have been sent out from the clinic a stat.  Patient is given strict ER precaution if any of her symptoms worsen or persist within the next few days.  Patient verbalized understanding and agreement with today's plan.    Final Clinical Impressions(s) / UC Diagnoses   Final diagnoses:  Essential hypertension  Dizziness  Nonintractable headache, unspecified chronicity pattern, unspecified headache type  Electrocardiogram showing T wave abnormalities     Discharge Instructions     Your ECG indicates mild changes from your prior ECG, however unable to evaluate if this abnormality is causing your dizziness.  As of today, I am starting on low dose of medication to improve control of your blood pressure.  Starting on amlodipine 2.5 mg to take at bedtime.  However if dizziness or headache  pain worsens I would recommend going immediately to the emergency department for further work-up and evaluation of your symptoms.    ED Prescriptions    Medication Sig Dispense Auth. Provider   amLODipine (NORVASC) 2.5 MG tablet Take 1 tablet (2.5 mg total) by mouth daily. 30 tablet Bing Neighbors, FNP     PDMP not reviewed this encounter.   Bing Neighbors, FNP 12/23/20 1310

## 2020-12-23 NOTE — Discharge Instructions (Addendum)
Your ECG indicates mild changes from your prior ECG, however unable to evaluate if this abnormality is causing your dizziness.  As of today, I am starting on low dose of medication to improve control of your blood pressure.  Starting on amlodipine 2.5 mg to take at bedtime.  However if dizziness or headache pain worsens I would recommend going immediately to the emergency department for further work-up and evaluation of your symptoms.

## 2020-12-31 ENCOUNTER — Other Ambulatory Visit: Payer: Self-pay

## 2020-12-31 ENCOUNTER — Encounter: Payer: Self-pay | Admitting: Internal Medicine

## 2020-12-31 ENCOUNTER — Ambulatory Visit: Payer: BC Managed Care – PPO | Attending: Internal Medicine | Admitting: Internal Medicine

## 2020-12-31 VITALS — BP 130/78 | HR 60 | Resp 16 | Ht 64.0 in | Wt 176.8 lb

## 2020-12-31 DIAGNOSIS — H34832 Tributary (branch) retinal vein occlusion, left eye, with macular edema: Secondary | ICD-10-CM | POA: Diagnosis not present

## 2020-12-31 DIAGNOSIS — R030 Elevated blood-pressure reading, without diagnosis of hypertension: Secondary | ICD-10-CM

## 2020-12-31 DIAGNOSIS — R9431 Abnormal electrocardiogram [ECG] [EKG]: Secondary | ICD-10-CM | POA: Insufficient documentation

## 2020-12-31 DIAGNOSIS — E669 Obesity, unspecified: Secondary | ICD-10-CM | POA: Insufficient documentation

## 2020-12-31 DIAGNOSIS — E66811 Obesity, class 1: Secondary | ICD-10-CM | POA: Insufficient documentation

## 2020-12-31 NOTE — Patient Instructions (Signed)
Try to check your blood pressure daily.  Normal blood pressure is 120/80 or lower.  Our goal is to keep your blood pressure 130/80 or lower.  If the blood pressure starts to increase above that, I would restart the low-dose of amlodipine.  Try to limit salt in the foods as much as possible as salt adversely affects the blood pressure.  Please have the ophthalmologist fax me the results of the blood test that he has ordered.   I have enclosed some information below on healthy eating habits and iron rich foods.  Healthy Eating Following a healthy eating pattern may help you to achieve and maintain a healthy body weight, reduce the risk of chronic disease, and live a long and productive life. It is important to follow a healthy eating pattern at an appropriate calorie level for your body. Your nutritional needs should be met primarily through food by choosing a variety of nutrient-rich foods. What are tips for following this plan? Reading food labels  Read labels and choose the following: ? Reduced or low sodium. ? Juices with 100% fruit juice. ? Foods with low saturated fats and high polyunsaturated and monounsaturated fats. ? Foods with whole grains, such as whole wheat, cracked wheat, brown rice, and wild rice. ? Whole grains that are fortified with folic acid. This is recommended for women who are pregnant or who want to become pregnant.  Read labels and avoid the following: ? Foods with a lot of added sugars. These include foods that contain brown sugar, corn sweetener, corn syrup, dextrose, fructose, glucose, high-fructose corn syrup, honey, invert sugar, lactose, malt syrup, maltose, molasses, raw sugar, sucrose, trehalose, or turbinado sugar.  Do not eat more than the following amounts of added sugar per day:  6 teaspoons (25 g) for women.  9 teaspoons (38 g) for men. ? Foods that contain processed or refined starches and grains. ? Refined grain products, such as white flour,  degermed cornmeal, white bread, and white rice. Shopping  Choose nutrient-rich snacks, such as vegetables, whole fruits, and nuts. Avoid high-calorie and high-sugar snacks, such as potato chips, fruit snacks, and candy.  Use oil-based dressings and spreads on foods instead of solid fats such as butter, stick margarine, or cream cheese.  Limit pre-made sauces, mixes, and "instant" products such as flavored rice, instant noodles, and ready-made pasta.  Try more plant-protein sources, such as tofu, tempeh, black beans, edamame, lentils, nuts, and seeds.  Explore eating plans such as the Mediterranean diet or vegetarian diet. Cooking  Use oil to saut or stir-fry foods instead of solid fats such as butter, stick margarine, or lard.  Try baking, boiling, grilling, or broiling instead of frying.  Remove the fatty part of meats before cooking.  Steam vegetables in water or broth. Meal planning  At meals, imagine dividing your plate into fourths: ? One-half of your plate is fruits and vegetables. ? One-fourth of your plate is whole grains. ? One-fourth of your plate is protein, especially lean meats, poultry, eggs, tofu, beans, or nuts.  Include low-fat dairy as part of your daily diet.   Lifestyle  Choose healthy options in all settings, including home, work, school, restaurants, or stores.  Prepare your food safely: ? Wash your hands after handling raw meats. ? Keep food preparation surfaces clean by regularly washing with hot, soapy water. ? Keep raw meats separate from ready-to-eat foods, such as fruits and vegetables. ? Cook seafood, meat, poultry, and eggs to the recommended internal temperature. ?  Store foods at safe temperatures. In general:  Keep cold foods at 55F (4.4C) or below.  Keep hot foods at 155F (60C) or above.  Keep your freezer at [facility] (-17.8C) or below.  Foods are no longer safe to eat when they have been between the temperatures of 40-155F (4.4-60C)  for more than 2 hours. What foods should I eat? Fruits Aim to eat 2 cup-equivalents of fresh, canned (in natural juice), or frozen fruits each day. Examples of 1 cup-equivalent of fruit include 1 small apple, 8 large strawberries, 1 cup canned fruit,  cup dried fruit, or 1 cup 100% juice. Vegetables Aim to eat 2-3 cup-equivalents of fresh and frozen vegetables each day, including different varieties and colors. Examples of 1 cup-equivalent of vegetables include 2 medium carrots, 2 cups raw, leafy greens, 1 cup chopped vegetable (raw or cooked), or 1 medium baked potato. Grains Aim to eat 6 ounce-equivalents of whole grains each day. Examples of 1 ounce-equivalent of grains include 1 slice of bread, 1 cup ready-to-eat cereal, 3 cups popcorn, or  cup cooked rice, pasta, or cereal. Meats and other proteins Aim to eat 5-6 ounce-equivalents of protein each day. Examples of 1 ounce-equivalent of protein include 1 egg, 1/2 cup nuts or seeds, or 1 tablespoon (16 g) peanut butter. A cut of meat or fish that is the size of a deck of cards is about 3-4 ounce-equivalents.  Of the protein you eat each week, try to have at least 8 ounces come from seafood. This includes salmon, trout, herring, and anchovies. Dairy Aim to eat 3 cup-equivalents of fat-free or low-fat dairy each day. Examples of 1 cup-equivalent of dairy include 1 cup (240 mL) milk, 8 ounces (250 g) yogurt, 1 ounces (44 g) natural cheese, or 1 cup (240 mL) fortified soy milk. Fats and oils  Aim for about 5 teaspoons (21 g) per day. Choose monounsaturated fats, such as canola and olive oils, avocados, peanut butter, and most nuts, or polyunsaturated fats, such as sunflower, corn, and soybean oils, walnuts, pine nuts, sesame seeds, sunflower seeds, and flaxseed. Beverages  Aim for six 8-oz glasses of water per day. Limit coffee to three to five 8-oz cups per day.  Limit caffeinated beverages that have added calories, such as soda and energy  drinks.  Limit alcohol intake to no more than 1 drink a day for nonpregnant women and 2 drinks a day for men. One drink equals 12 oz of beer (355 mL), 5 oz of wine (148 mL), or 1 oz of hard liquor (44 mL). Seasoning and other foods  Avoid adding excess amounts of salt to your foods. Try flavoring foods with herbs and spices instead of salt.  Avoid adding sugar to foods.  Try using oil-based dressings, sauces, and spreads instead of solid fats. This information is based on general [place] nutrition guidelines. For more information, visit [employer]. Exact amounts may vary based on your nutrition needs. Summary  A healthy eating plan may help you to maintain a healthy weight, reduce the risk of chronic diseases, and stay active throughout your life.  Plan your meals. Make sure you eat the right portions of a variety of nutrient-rich foods.  Try baking, boiling, grilling, or broiling instead of frying.  Choose healthy options in all settings, including home, work, school, restaurants, or stores. This information is not intended to replace advice given to you by your health care provider. Make sure you discuss any questions you have with your health care provider. Document  Revised: 12/10/2017 Document Reviewed: 12/10/2017 Elsevier Patient Education  2021 [place].   Iron-Rich Diet  Iron is a mineral that helps your body to produce hemoglobin. Hemoglobin is a protein in red blood cells that carries oxygen to your body's tissues. Eating too little iron may cause you to feel weak and tired, and it can increase your risk of infection. Iron is naturally found in many foods, and many foods have iron added to them (iron-fortified foods). You may need to follow an iron-rich diet if you do not have enough iron in your body due to certain medical conditions. The amount of iron that you need each day depends on your age, your sex, and any medical conditions you have. Follow instructions from  your health care provider or a diet and nutrition specialist (dietitian) about how much iron you should eat each day. What are tips for following this plan? Reading food labels  Check food labels to see how many milligrams (mg) of iron are in each serving. Cooking  Cook foods in pots and pans that are made from iron.  Take these steps to make it easier for your body to absorb iron from certain foods: ? Soak beans overnight before cooking. ? Soak whole grains overnight and drain them before using. ? Ferment flours before baking, such as by using yeast in bread dough. Meal planning  When you eat foods that contain iron, you should eat them with foods that are high in vitamin C. These include oranges, peppers, tomatoes, potatoes, and mango. Vitamin C helps your body to absorb iron. General information  Take iron supplements only as told by your health care provider. An overdose of iron can be life-threatening. If you were prescribed iron supplements, take them with orange juice or a vitamin C supplement.  When you eat iron-fortified foods or take an iron supplement, you should also eat foods that naturally contain iron, such as meat, poultry, and fish. Eating naturally iron-rich foods helps your body to absorb the iron that is added to other foods or contained in a supplement.  Certain foods and drinks prevent your body from absorbing iron properly. Avoid eating these foods in the same meal as iron-rich foods or with iron supplements. These foods include: ? Coffee, black tea, and red wine. ? Milk, dairy products, and foods that are high in calcium. ? Beans and soybeans. ? Whole grains. What foods should I eat? Fruits Prunes. Raisins. Eat fruits high in vitamin C, such as oranges, grapefruits, and strawberries, alongside iron-rich foods. Vegetables Spinach (cooked). Green peas. Broccoli. Fermented vegetables. Eat vegetables high in vitamin C, such as leafy greens, potatoes, bell peppers,  and tomatoes, alongside iron-rich foods. Grains Iron-fortified breakfast cereal. Iron-fortified whole-wheat bread. Enriched rice. Sprouted grains. Meats and other proteins Beef liver. Oysters. Beef. Shrimp. [place]. Chicken. [place]. Sardines. Chickpeas. Nuts. Tofu. Pumpkin seeds. Beverages Tomato juice. Fresh orange juice. Prune juice. Hibiscus tea. Fortified instant breakfast shakes. Sweets and desserts Blackstrap molasses. Seasonings and condiments Tahini. Fermented soy sauce. Other foods Wheat germ. The items listed above may not be a complete list of recommended foods and beverages. Contact a dietitian for more information. What foods should I avoid? Grains Whole grains. Bran cereal. Bran flour. Oats. Meats and other proteins Soybeans. Products made from soy protein. Black beans. Lentils. Mung beans. Split peas. Dairy Milk. Cream. Cheese. Yogurt. Cottage cheese. Beverages Coffee. Black tea. Red wine. Sweets and desserts Cocoa. Chocolate. Ice cream. Other foods Basil. Oregano. Large amounts of parsley. The  items listed above may not be a complete list of foods and beverages to avoid. Contact a dietitian for more information. Summary  Iron is a mineral that helps your body to produce hemoglobin. Hemoglobin is a protein in red blood cells that carries oxygen to your body's tissues.  Iron is naturally found in many foods, and many foods have iron added to them (iron-fortified foods).  When you eat foods that contain iron, you should eat them with foods that are high in vitamin C. Vitamin C helps your body to absorb iron.  Certain foods and drinks prevent your body from absorbing iron properly, such as whole grains and dairy products. You should avoid eating these foods in the same meal as iron-rich foods or with iron supplements. This information is not intended to replace advice given to you by your health care provider. Make sure you discuss any questions you have with your health  care provider. Document Revised: 08/10/2017 Document Reviewed: 07/24/2017 Elsevier Patient Education  2021 [employer].

## 2020-12-31 NOTE — Progress Notes (Signed)
Patient ID: Kelly Arnold, female    DOB: 12/15/1955  MRN: [id number]  CC: Follow-up   Subjective: Kelly Arnold is a 65 y.o. female who presents for chronic ds management and UC f/u.  Previous PCP is Dr. Jillyn Arnold who is no longer with the practice. Her concerns today include:  Pt with hx of HTN, LT retinal vein occlusion 11/2020  Patient seen in urgent care on the 14th of this month with dizziness and headache.  Blood pressure found to be elevated.  EKG was abnormal unchanged from previous EKG done in 2013.  She was started on low-dose amlodipine.  CBC revealed hemoglobin in the low normal range and stable compared to a year ago.  Chemistry normal.  Blood sugar normal.  She comes today for follow-up.  She tells me that she is doing better.  She has been checking her blood pressure almost daily.  She stop the Norvasc because blood pressure had been running 101-118/70s.  She stopped the medicine 3 days ago.  Since then blood pressure yesterday was 111/73.  BP this morning was 123/72.  She uses a cough.  She limits salt in the foods.  -No chest pains or shortness of breath.  No family history of heart disease.  She has not had a lipid profile done. -She walks about 2-3 times a week for exercise.  Diagnosed with branch retinal vein occlusion in the left eye last month by Dr. Maeola Arnold of [facility].  Patient states she contracted COVID at the end of January and was placed on Molnupiravir.  While she was taking the medication, she woke up one morning with a dark spot/shadow in the left eye.  She saw an optometrist who then referred her to a retinal specialist.  She was diagnosed with retinal vein occlusion.  The ophthalmologist has ordered a panel of blood test on her including lipid profile and hypercoagulable work-up which she will have done at [facility].  He also recommended injections to the eye but patient is reluctant to have it done.  She feels the eye is getting a little bit better.  She  wonders whether the problem was caused by the antiviral medication or Covid-19 Moderna vaccine which she received sometime last year.  She does not recall when she received the vaccine but she has had 2 doses of it.  She recently found a study on line in journal called [employer] authored by Kelly Arnold entitle branch retinal vein occlusion after m-RNA-based COVID vaccination.  It described a young 65 year old patient who developed this issue soon after receiving the vaccine.    Patient Active Problem List   Diagnosis Date Noted  . Normocytic anemia 05/11/2012  . Sinus tachycardia 05/11/2012     Current Outpatient Medications on File Prior to Visit  Medication Sig Dispense Refill  . amLODipine (NORVASC) 2.5 MG tablet Take 1 tablet (2.5 mg total) by mouth daily. 30 tablet 0  . Ascorbic Acid (VITAMIN C PO) Take by mouth.    . B Complex-C (B-COMPLEX WITH VITAMIN C) tablet Take 1 tablet by mouth daily.    . Multiple Vitamin (MULTIVITAMIN WITH MINERALS) TABS Take 1 tablet by mouth daily.    Marland Kitchen VITAMIN D PO Take by mouth.     No current facility-administered medications on file prior to visit.    No Known Allergies  Social History   Socioeconomic History  . Marital status: Unknown    Spouse name: Not on file  . Number of children: Not  on file  . Years of education: Not on file  . Highest education level: Not on file  Occupational History  . Not on file  Tobacco Use  . Smoking status: Never Smoker  . Smokeless tobacco: Never Used  Vaping Use  . Vaping Use: Never used  Substance and Sexual Activity  . Alcohol use: No  . Drug use: No  . Sexual activity: Yes  Other Topics Concern  . Not on file  Social History Narrative  . Not on file   Social Determinants of Health   Financial Resource Strain: Not on file  Food Insecurity: Not on file  Transportation Needs: Not on file  Physical Activity: Not on file  Stress: Not on file  Social Connections: Not on file  Intimate  Partner Violence: Not on file    Family History  Problem Relation Age of Onset  . Hypertension Neg Hx   . Diabetes Neg Hx   . CAD Neg Hx   . CVA Neg Hx   . Cancer Neg Hx   . Colon cancer Neg Hx   . Colon polyps Neg Hx   . Esophageal cancer Neg Hx   . Rectal cancer Neg Hx   . Stomach cancer Neg Hx     Past Surgical History:  Procedure Laterality Date  . CESAREAN SECTION     x2    ROS: Review of Systems Negative except as stated above  PHYSICAL EXAM: BP 130/78   Pulse 60   Resp 16   Ht 5\' 4"  (1.626 m)   Wt 176 lb 12.8 oz (80.2 kg)   SpO2 99%   BMI 30.35 kg/m   Wt Readings from Last 3 Encounters:  12/31/20 176 lb 12.8 oz (80.2 kg)  09/02/20 180 lb (81.6 kg)  08/20/20 180 lb (81.6 kg)    Physical Exam  General appearance - alert, well appearing, and in no distress Mental status - normal mood, behavior, speech, dress, motor activity, and thought processes Neck - supple, no significant adenopathy Chest - clear to auscultation, no wheezes, rales or rhonchi, symmetric air entry Heart - normal rate, regular rhythm, normal S1, S2, no murmurs, rubs, clicks or gallops Extremities - peripheral pulses normal, no pedal edema, no clubbing or cyanosis   CMP Latest Ref Rng & Units 12/23/2020 09/27/2018 05/12/2012  Glucose 65 - 99 mg/dL 90 89 86  BUN 8 - 27 mg/dL 8 14 5(L)  Creatinine 07/12/2012 - 1.00 mg/dL 7.02 6.37 8.58  Sodium 134 - 144 mmol/L 140 142 139  Potassium 3.5 - 5.2 mmol/L 5.2 4.6 4.1  Chloride 96 - 106 mmol/L 105 105 109  CO2 20 - 29 mmol/L 27 21 24   Calcium 8.7 - 10.3 mg/dL 9.7 9.9 8.5  Total Protein 6.0 - 8.5 g/dL 7.0 7.4 6.6  Total Bilirubin 0.0 - 1.2 mg/dL 0.5 0.3 0.8  Alkaline Phos 44 - 121 IU/L 74 79 135(H)  AST 0 - 40 IU/L 24 19 42(H)  ALT 0 - 32 IU/L 18 15 29    Lipid Panel  No results found for: CHOL, TRIG, HDL, CHOLHDL, VLDL, LDLCALC, LDLDIRECT  CBC    Component Value Date/Time   WBC 4.4 12/23/2020 1155   WBC 8.0 05/12/2012 0615   RBC 4.96  12/23/2020 1155   RBC 2.99 (L) 05/12/2012 0615   HGB 12.3 12/23/2020 1155   HCT 36.4 12/23/2020 1155   PLT 231 12/23/2020 1155   MCV 73 (L) 12/23/2020 1155   MCH 24.8 (L) 12/23/2020 1155  MCH 24.4 (L) 05/12/2012 0615   MCHC 33.8 12/23/2020 1155   MCHC 31.2 05/12/2012 0615   RDW 14.8 12/23/2020 1155   LYMPHSABS 2.7 09/27/2018 1659   MONOABS 1.8 (H) 05/11/2012 0910   EOSABS 0.1 09/27/2018 1659   BASOSABS 0.0 09/27/2018 1659   EKG: I looked at the EKG that was done on the 14th of this month when she was in urgent care.  It shows poor R wave progression with new T wave inversions in the inferior and lateral leads when compared to EKG that was done back in 2013.  ASSESSMENT AND PLAN: 1. Elevated blood-pressure reading without diagnosis of hypertension Blood pressure today is not bad.  Patient gives good readings having been off the Norvasc for 3 days.  Advised that she continues to monitor blood pressure at least once a day.  Normal blood pressure is 120/80.  However as long as blood pressure is staying 130/80 or lower she is good.  Most important that blood pressure be controlled given recent diagnosis of retinal vein occlusion.  2. 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  I will see what I can find any information on retinal vein occlusion being associated with COVID-19 vaccines.  Recommend that she also share this information with her ophthalmologist. -I gave her my business card and requested that she request her ophthalmologist fax me the results of labs that he ordered including the lipid profile and hypercoagulable work-up  3. Obesity (BMI 30.0-34.9) Recommend getting in at least 150 minutes/week of moderate intensity exercise like  brisk walking.  Discussed and encourage healthy eating habits.  Printed information given.  4. Abnormal EKG Will refer to cardiology for further evaluation - Ambulatory referral to Cardiology   Patient was given the opportunity to ask questions.  Patient verbalized understanding of the plan and was able to repeat key elements of the plan.   Orders Placed This Encounter  Procedures  . Ambulatory referral to Cardiology     Requested Prescriptions    No prescriptions requested or ordered in this encounter    Return in about 4 months (around 05/02/2021).  Jonah Blue, MD, FACP

## 2021-01-21 ENCOUNTER — Telehealth: Payer: Self-pay | Admitting: Internal Medicine

## 2021-01-21 MED ORDER — AMLODIPINE BESYLATE 5 MG PO TABS: 5.0000 mg | ORAL_TABLET | Freq: Every day | ORAL | 2 refills | Status: DC

## 2021-01-21 NOTE — Telephone Encounter (Signed)
Pt would like rx sent to [facility] [address]

## 2021-01-21 NOTE — Telephone Encounter (Signed)
Prescription for Norvasc 5 mg daily sent to [facility].

## 2021-01-21 NOTE — Telephone Encounter (Signed)
Copied from CRM [id number]. Topic: General - Other >> Jan 20, 2021  5:57 PM Pawlus, Maxine Glenn A wrote: Reason for CRM: Pt stated she does not think amLODipine (NORVASC) 2.5 MG tablet is working for her, pt requested a call back to see if her medication could be adjusted or changed. Please advise.

## 2021-01-21 NOTE — Telephone Encounter (Signed)
Will forward to provider  

## 2021-01-26 ENCOUNTER — Encounter: Payer: Self-pay | Admitting: Internal Medicine

## 2021-01-26 ENCOUNTER — Telehealth: Payer: Self-pay | Admitting: Internal Medicine

## 2021-01-26 NOTE — Progress Notes (Signed)
I received lab results from Dr. Lelan Pons with [facility] retina specialist.  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 A1c level of 6, Lipid profile: Total cholesterol of 230, LDL cholesterol of 145.

## 2021-01-27 NOTE — Telephone Encounter (Signed)
Contacted pt to go over provider message pt didn't answer lvm asking for a call back at her earliest convenience

## 2021-02-02 ENCOUNTER — Ambulatory Visit: Payer: BC Managed Care – PPO | Admitting: Cardiovascular Disease

## 2021-02-02 NOTE — Telephone Encounter (Signed)
Pt is calling back and would like the results from retina specialist and also they was unable to perform sickle cell trait test due to lab already thrown the blood away

## 2021-02-02 NOTE — Telephone Encounter (Signed)
Returned pt call and went over Dr. Laural Benes message and pt doesn't have any questions or concerns

## 2021-04-15 ENCOUNTER — Telehealth: Payer: Self-pay | Admitting: Internal Medicine

## 2021-04-15 DIAGNOSIS — I10 Essential (primary) hypertension: Secondary | ICD-10-CM

## 2021-04-15 NOTE — Telephone Encounter (Signed)
Pt called in to confirm her apt, pt says that she has also noticed that her amLODipine (NORVASC) 5 MG tablet is causing her to retain fluid. Pt would like to know if provider could make some changes to her Rx before she is scheduled to see her?   Please advise.   CB: [id number]  - Home   [phone] - Cell

## 2021-04-16 MED ORDER — HYDROCHLOROTHIAZIDE 12.5 MG PO TABS: 12.5000 mg | ORAL_TABLET | Freq: Every day | ORAL | 3 refills | Status: DC

## 2021-04-18 NOTE — Telephone Encounter (Signed)
As instructed Called pt made aware of Md message notes and instructions. Verbalized understanding

## 2021-04-22 ENCOUNTER — Other Ambulatory Visit: Payer: Self-pay

## 2021-04-22 ENCOUNTER — Ambulatory Visit
Admission: EM | Admit: 2021-04-22 | Discharge: 2021-04-22 | Disposition: A | Discharge status: Home / Self Care | Payer: BC Managed Care – PPO | Attending: Internal Medicine | Admitting: Internal Medicine

## 2021-04-22 DIAGNOSIS — R9431 Abnormal electrocardiogram [ECG] [EKG]: Secondary | ICD-10-CM | POA: Insufficient documentation

## 2021-04-22 DIAGNOSIS — J029 Acute pharyngitis, unspecified: Secondary | ICD-10-CM | POA: Diagnosis present

## 2021-04-22 DIAGNOSIS — J069 Acute upper respiratory infection, unspecified: Secondary | ICD-10-CM | POA: Insufficient documentation

## 2021-04-22 DIAGNOSIS — R059 Cough, unspecified: Secondary | ICD-10-CM | POA: Diagnosis not present

## 2021-04-22 DIAGNOSIS — Z20822 Contact with and (suspected) exposure to covid-19: Secondary | ICD-10-CM | POA: Diagnosis present

## 2021-04-22 LAB — POCT RAPID STREP A (OFFICE): Rapid Strep A Screen: NEGATIVE

## 2021-04-22 NOTE — Discharge Instructions (Addendum)
You likely having a viral upper respiratory infection. We recommended symptom control. I expect your symptoms to start improving in the next 1-2 weeks.   1. Take a daily allergy pill/anti-histamine like Zyrtec, Claritin, or Store brand consistently for 2 weeks  2. For congestion you may try an oral decongestant like Mucinex or coricidin HBP.  Please avoid any medication that ends in D or DM as these may raise your blood pressure.  You may also try intranasal flonase nasal spray or saline irrigations (neti pot, sinus cleanse)  3. For your sore throat you may try cepacol lozenges, salt water gargles, throat spray. Treatment of congestion may also help your sore throat.  4. For cough you may try Robitussen  5. Take Tylenol or Ibuprofen to help with pain/inflammation  6. Stay hydrated, drink plenty of fluids to keep throat coated and less irritated  Honey Tea For cough/sore throat try using a honey-based tea. Use 3 teaspoons of honey with juice squeezed from half lemon. Place shaved pieces of ginger into 1/2-1 cup of water and warm over stove top. Then mix the ingredients and repeat every 4 hours as needed.   Rapid strep test was negative in urgent care today.  Throat culture and COVID-19 viral swab are pending.

## 2021-04-22 NOTE — ED Triage Notes (Signed)
Pt c/o cough, sore throat, congestion, and body aches x2 days.

## 2021-04-22 NOTE — ED Provider Notes (Signed)
[facility]    CSN: [id number] Arrival date & time: 04/22/21  1245      History   Chief Complaint Chief Complaint  Patient presents with   Cough    HPI Kelly Arnold is a 65 y.o. female.   Patient presents with 2-day history of cough, sore throat, congestion, body aches.  Denies any known fevers.  Son has similar symptoms.  Patient has not yet taken any over-the-counter medication to alleviate symptoms.  Denies any chest pain or shortness of breath.   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      Home Medications    Prior to Admission medications   Medication Sig Start Date End Date Taking? Authorizing Provider  Ascorbic Acid (VITAMIN C PO) Take by mouth.    [provider]  B Complex-C (B-COMPLEX WITH VITAMIN C) tablet Take 1 tablet by mouth daily.    [provider]  hydrochlorothiazide (HYDRODIURIL) 12.5 MG tablet Take 1 tablet (12.5 mg total) by mouth daily. 04/16/21   Marcine Matar, MD  Multiple Vitamin (MULTIVITAMIN WITH MINERALS) TABS Take 1 tablet by mouth daily.    [provider]  VITAMIN D PO Take by mouth.    [provider]    Family History Family History  Problem Relation Age of Onset   Hypertension Neg Hx    Diabetes Neg Hx    CAD Neg Hx    CVA Neg Hx    Cancer Neg Hx    Colon cancer Neg Hx    Colon polyps Neg Hx    Esophageal cancer Neg Hx    Rectal cancer Neg Hx    Stomach cancer Neg Hx     Social History Social History   Tobacco Use    Smoking status: Never   Smokeless tobacco: Never  Vaping Use   Vaping Use: Never used  Substance Use Topics   Alcohol use: No   Drug use: No     Allergies   Patient has no known allergies.   Review of Systems Review of Systems Per HPI  Physical Exam Triage Vital Signs ED Triage Vitals [04/22/21 0907]  Enc Vitals Group     BP 131/84     Pulse Rate 82     Resp 18     Temp 98.7 F (37.1 C)     Temp Source Oral     SpO2 95 %     Weight      Height      Head Circumference      Peak Flow      Pain Score 0     Pain Loc      Pain Edu?      Excl. in GC?    No data found.  Updated Vital Signs BP 131/84 (BP Location: Left Arm)   Pulse 82   Temp 98.7 F (37.1 C) (Oral)   Resp 18   SpO2 95%   Visual Acuity Right Eye  Distance:   Left Eye Distance:   Bilateral Distance:    Right Eye Near:   Left Eye Near:    Bilateral Near:     Physical Exam Constitutional:      General: She is not in acute distress.    Appearance: Normal appearance.  HENT:     Head: Normocephalic and atraumatic.     Right Ear: Ear canal normal. A middle ear effusion is present.     Left Ear: Ear canal normal. A middle ear effusion is present.     Nose: Congestion present.     Mouth/Throat:     Mouth: Mucous membranes are moist.     Pharynx: Posterior oropharyngeal erythema present.  Eyes:     Extraocular Movements: Extraocular movements intact.     Conjunctiva/sclera: Conjunctivae normal.     Pupils: Pupils are equal, round, and reactive to light.  Cardiovascular:     Rate and Rhythm: Normal rate and regular rhythm.     Pulses: Normal pulses.     Heart sounds: Normal heart sounds.  Pulmonary:     Effort: Pulmonary effort is normal. No respiratory distress.     Breath sounds: Normal breath sounds. No stridor. No wheezing or rhonchi.  Abdominal:     General: Abdomen is flat. Bowel sounds are normal.     Palpations: Abdomen is soft.  Musculoskeletal:        General: Normal range of  motion.     Cervical back: Normal range of motion.  Skin:    General: Skin is warm and dry.  Neurological:     General: No focal deficit present.     Mental Status: She is alert and oriented to person, place, and time. Mental status is at baseline.  Psychiatric:        Mood and Affect: Mood normal.        Behavior: Behavior normal.     UC Treatments / Results  Labs (all labs ordered are listed, but only abnormal results are displayed) Labs Reviewed  NOVEL CORONAVIRUS, NAA  CULTURE, GROUP A STREP [facility])  POCT RAPID STREP A (OFFICE)    EKG   Radiology No results found.  Procedures Procedures (including critical care time)  Medications Ordered in UC Medications - No data to display  Initial Impression / Assessment and Plan / UC Course  I have reviewed the triage vital signs and the nursing notes.  Pertinent labs & imaging results that were available during my care of the patient were reviewed by me and considered in my medical decision making (see chart for details).     Patient presents with symptoms likely from a viral upper respiratory infection. Differential includes bacterial pneumonia, sinusitis, allergic rhinitis, Covid 19. Do not suspect underlying cardiopulmonary process. Symptoms seem unlikely related to ACS, CHF or COPD exacerbations, pneumonia, pneumothorax. Patient is nontoxic appearing and not in need of emergent medical intervention.   Discussed safe over-the-counter medications due to patient's history of high blood pressure.  Patient may take Mucinex or Coricidin HBP.  Rapid strep test was negative in urgent care today.  Throat culture and COVID-19 viral swab are pending.  Advised patient to take oral antihistamine and Flonase over-the-counter for fluid in ears.  Recommended symptom control with over the counter medications: Daily oral anti-histamine, Oral decongestant or IN corticosteroid, saline irrigations, cepacol lozenges, Mucinex, Coricidin HBP, honey  tea.  Return if symptoms fail to improve in 1-2 weeks or you develop shortness of breath, chest pain, severe headache.  Patient states understanding and is agreeable.  Discharged with PCP followup.  Final Clinical Impressions(s) / UC Diagnoses   Final diagnoses:  Abnormal EKG  Viral upper respiratory illness  Sore throat  Cough  Encounter for laboratory testing for COVID-19 virus     Discharge Instructions      You likely having a viral upper respiratory infection. We recommended symptom control. I expect your symptoms to start improving in the next 1-2 weeks.   1. Take a daily allergy pill/anti-histamine like Zyrtec, Claritin, or Store brand consistently for 2 weeks  2. For congestion you may try an oral decongestant like Mucinex or coricidin HBP.  Please avoid any medication that ends in D or DM as these may raise your blood pressure.  You may also try intranasal flonase nasal spray or saline irrigations (neti pot, sinus cleanse)  3. For your sore throat you may try cepacol lozenges, salt water gargles, throat spray. Treatment of congestion may also help your sore throat.  4. For cough you may try Robitussen  5. Take Tylenol or Ibuprofen to help with pain/inflammation  6. Stay hydrated, drink plenty of fluids to keep throat coated and less irritated  Honey Tea For cough/sore throat try using a honey-based tea. Use 3 teaspoons of honey with juice squeezed from half lemon. Place shaved pieces of ginger into 1/2-1 cup of water and warm over stove top. Then mix the ingredients and repeat every 4 hours as needed.   Rapid strep test was negative in urgent care today.  Throat culture and COVID-19 viral swab are pending.     ED Prescriptions   None    PDMP not reviewed this encounter.   Lance Muss, FNP 04/22/21 1013

## 2021-04-23 LAB — NOVEL CORONAVIRUS, NAA: SARS-CoV-2, NAA: DETECTED — AB

## 2021-04-23 LAB — SARS-COV-2, NAA 2 DAY TAT

## 2021-04-25 ENCOUNTER — Telehealth: Payer: Self-pay | Admitting: Internal Medicine

## 2021-04-25 DIAGNOSIS — I10 Essential (primary) hypertension: Secondary | ICD-10-CM

## 2021-04-25 LAB — CULTURE, GROUP A STREP (THRC)

## 2021-04-25 NOTE — Telephone Encounter (Signed)
Patient called and stated she has an appointment on 05/06/21. She would like to get her labs done before the visit. She also stated she was diagnosed with Covid on 04/22/21. I informed her she wouldn't be able to come into the office. Patient would like a call back please

## 2021-04-28 NOTE — Telephone Encounter (Signed)
Please contact pt and schedule a lab visit  

## 2021-04-28 NOTE — Telephone Encounter (Signed)
Dr. Laural Benes would you like me to place lab orders for cbc, cmp, lipid and a1c

## 2021-05-02 ENCOUNTER — Other Ambulatory Visit: Payer: Self-pay

## 2021-05-02 ENCOUNTER — Ambulatory Visit: Payer: BC Managed Care – PPO | Attending: Internal Medicine

## 2021-05-02 DIAGNOSIS — I10 Essential (primary) hypertension: Secondary | ICD-10-CM

## 2021-05-03 LAB — BASIC METABOLIC PANEL
BUN/Creatinine Ratio: 17 (ref 12–28)
BUN: 13 mg/dL (ref 8–27)
CO2: 25 mmol/L (ref 20–29)
Calcium: 9.8 mg/dL (ref 8.7–10.3)
Chloride: 100 mmol/L (ref 96–106)
Creatinine, Ser: 0.77 mg/dL (ref 0.57–1.00)
Glucose: 89 mg/dL (ref 65–99)
Potassium: 4.2 mmol/L (ref 3.5–5.2)
Sodium: 139 mmol/L (ref 134–144)
eGFR: 87 mL/min/{1.73_m2} (ref >59)

## 2021-05-03 LAB — LIPID PANEL
Chol/HDL Ratio: 3.4 ratio (ref 0.0–4.4)
Cholesterol, Total: 220 mg/dL — ABNORMAL HIGH (ref 100–199)
HDL: 64 mg/dL (ref >39)
LDL Chol Calc (NIH): 145 mg/dL — ABNORMAL HIGH (ref 0–99)
Triglycerides: 61 mg/dL (ref 0–149)
VLDL Cholesterol Cal: 11 mg/dL (ref 5–40)

## 2021-05-05 NOTE — Telephone Encounter (Signed)
Called patient no answer and a voicemail was left about the lab work and her appt on  8/26

## 2021-05-06 ENCOUNTER — Ambulatory Visit: Payer: BC Managed Care – PPO | Attending: Internal Medicine | Admitting: Internal Medicine

## 2021-05-06 ENCOUNTER — Other Ambulatory Visit: Payer: Self-pay

## 2021-05-06 VITALS — BP 129/80 | HR 56 | Resp 16 | Wt 174.2 lb

## 2021-05-06 DIAGNOSIS — R7303 Prediabetes: Secondary | ICD-10-CM | POA: Diagnosis not present

## 2021-05-06 DIAGNOSIS — E663 Overweight: Secondary | ICD-10-CM | POA: Diagnosis not present

## 2021-05-06 DIAGNOSIS — E782 Mixed hyperlipidemia: Secondary | ICD-10-CM | POA: Diagnosis not present

## 2021-05-06 DIAGNOSIS — I10 Essential (primary) hypertension: Secondary | ICD-10-CM

## 2021-05-06 DIAGNOSIS — Z2821 Immunization not carried out because of patient refusal: Secondary | ICD-10-CM

## 2021-05-06 DIAGNOSIS — Z1231 Encounter for screening mammogram for malignant neoplasm of breast: Secondary | ICD-10-CM

## 2021-05-06 DIAGNOSIS — Z6829 Body mass index (BMI) 29.0-29.9, adult: Secondary | ICD-10-CM

## 2021-05-06 NOTE — Progress Notes (Signed)
Patient ID: Kelly Arnold, female    DOB: 03/16/1956  MRN: [id number]  CC: Hypertension   Subjective: Kelly Arnold is a 65 y.o. female who presents for chronic ds management Her concerns today include:  Pt with hx of HTN, obesity, LT retinal vein occlusion 11/2020, COVID infection 09/2020  Got COVID for 2nd time the middle of this mth.  Contracted from her son. Had 2 Moderna vaccines last yr. not interested in having the booster shot.  HYPERTENSION -Norvasc changed to HCTZ earlier this mth because the former was causing LE edema.  She feels the HCTZ is working better - not feling bloated and legs not swollen.  She had stopped the HCTZ for a week when she had the COVID infection.  She was off of it when she came to have recent blood test done for chemistry and cholesterol. Walking every day for exercise.  Doing better with eating habits.  Reports that she is eating less meat.  She feels she has lost some weight and inches.  Wgh at home at 171.  Feels clothes are loose. I went over her recent labs with her.  Her LDL cholesterol is 145 with goal being less than 100.  Also the blood test results that were done by her ophthalmologist in May of this year showed that her A1c was 6 which was in the range for prediabetes. On last visit I referred her to the cardiologist for abnormal EKG.  The appointment was scheduled but she subsequently canceled it because she was feeling okay and wondered whether she really needed to go.  HM: She declines flu shot, pneumonia vaccine.  Due for Pap smear.  Due for mammogram.  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     B Complex-C (B-COMPLEX WITH VITAMIN C) tablet Take 1 tablet by mouth daily.     hydrochlorothiazide (HYDRODIURIL) 12.5 MG tablet Take 1 tablet (12.5 mg total) by mouth daily. 30 tablet 3   Multiple Vitamin (MULTIVITAMIN WITH MINERALS) TABS Take 1 tablet by mouth daily.     VITAMIN D PO Take by mouth.     No current facility-administered medications on file prior to visit.    No Known Allergies  Social History   Socioeconomic History   Marital status: Married    Spouse name: Not on file   Number of children: Not on file   Years of education: Not on file   Highest education level: Not on file  Occupational History   Not on file  Tobacco Use   Smoking status: Never   Smokeless tobacco: Never  Vaping Use   Vaping Use: Never used  Substance and Sexual Activity   Alcohol use: No   Drug use: No   Sexual activity: Yes  Other Topics Concern   Not on file  Social History Narrative   Not on file   Social Determinants of Health   Financial Resource Strain: Not on file  Food Insecurity: Not on file  Transportation Needs: Not on file  Physical Activity: Not on file  Stress: Not on file  Social Connections: Not on file  Intimate  Partner Violence: Not on file    Family History  Problem Relation Age of Onset   Hypertension Neg Hx    Diabetes Neg Hx    CAD Neg Hx    CVA Neg Hx    Cancer Neg Hx    Colon cancer Neg Hx    Colon polyps Neg Hx    Esophageal cancer Neg Hx    Rectal cancer Neg Hx    Stomach cancer Neg Hx     Past Surgical History:  Procedure Laterality Date   CESAREAN SECTION     x2    ROS: Review of Systems Negative except as stated above  PHYSICAL EXAM: BP 129/80   Pulse (!) 56   Resp 16   Wt 174 lb 3.2 oz (79 kg)   SpO2 98%   BMI 29.90 kg/m   Wt Readings from Last 3 Encounters:  05/06/21 174 lb 3.2 oz (79 kg)  12/31/20 176 lb 12.8 oz (80.2 kg)  09/02/20 180 lb (81.6 kg)    Physical Exam  General appearance - alert, well  appearing, and in no distress Mental status - normal mood, behavior, speech, dress, motor activity, and thought processes Neck - supple, no significant adenopathy Chest - clear to auscultation, no wheezes, rales or rhonchi, symmetric air entry Heart - normal rate, regular rhythm, normal S1, S2, no murmurs, rubs, clicks or gallops Extremities - peripheral pulses normal, no pedal edema, no clubbing or cyanosis  The 10-year ASCVD risk score Denman George DC Jr., et al., 2013) is: 8.6%   Values used to calculate the score:     Age: 11 years     Sex: Female     Is Non-Hispanic African American: Yes     Diabetic: No     Tobacco smoker: No     Systolic Blood Pressure: 129 mmHg     Is BP treated: Yes     HDL Cholesterol: 64 mg/dL     Total Cholesterol: 220 mg/dL  CMP Latest Ref Rng & Units 05/02/2021 12/23/2020 09/27/2018  Glucose 65 - 99 mg/dL 89 90 89  BUN 8 - 27 mg/dL 13 8 14   Creatinine 0.57 - 1.00 mg/dL 0.62 6.94  Sodium 134 - 144 mmol/L 139 140 142  Potassium 3.5 - 5.2 mmol/L 4.2 5.2 4.6  Chloride 96 - 106 mmol/L 100 105 105  CO2 20 - 29 mmol/L 25 27 21   Calcium 8.7 - 10.3 mg/dL 9.8 9.7 9.9  Total Protein 6.0 - 8.5 g/dL - 7.0 7.4  Total Bilirubin 0.0 - 1.2 mg/dL - 0.5 0.3  Alkaline Phos 44 - 121 IU/L - 74 79  AST 0 - 40 IU/L - 24 19  ALT 0 - 32 IU/L - 18 15   Lipid Panel     Component Value Date/Time   CHOL 220 (H) 05/02/2021 1602   TRIG 61 05/02/2021 1602   HDL 64 05/02/2021 1602   CHOLHDL 3.4 05/02/2021 1602   LDLCALC 145 (H) 05/02/2021 1602    CBC    Component Value Date/Time   WBC 4.4 12/23/2020 1155   WBC 8.0 05/12/2012 0615   RBC 4.96 12/23/2020 1155   RBC 2.99 (L) 05/12/2012 0615   HGB 12.3 12/23/2020 1155   HCT 36.4 12/23/2020 1155   PLT 231 12/23/2020 1155   MCV 73 (L) 12/23/2020 1155   MCH 24.8 (L) 12/23/2020 1155   MCH 24.4 (L) 05/12/2012 0615   MCHC 33.8 12/23/2020 1155   MCHC 31.2 05/12/2012 0615  RDW 14.8 12/23/2020 1155   LYMPHSABS 2.7 09/27/2018  1659   MONOABS 1.8 (H) 05/11/2012 0910   EOSABS 0.1 09/27/2018 1659   BASOSABS 0.0 09/27/2018 1659    ASSESSMENT AND PLAN: 1. Essential hypertension At goal.  Continue HCTZ.  Given that she was off the HCTZ at the time of recent blood tests, I request that she comes back in 1 to 2 weeks to have potassium level checked to make sure it is staying okay on the HCTZ - Potassium; Future  2. Overweight (BMI 25.0-29.9) Commended her on her efforts.  Encouraged her to continue healthy eating habits and regular exercise.  3. Mixed hyperlipidemia Went over the significance of the ASCVD score.  Encouraged her to continue healthy eating habits and regular exercise  4. Prediabetes See #2 above  5. Influenza vaccination declined   6. Encounter for screening mammogram for malignant neoplasm of breast - MM Digital Screening; Future   Patient was given the opportunity to ask questions.  Patient verbalized understanding of the plan and was able to repeat key elements of the plan.   Orders Placed This Encounter  Procedures   MM Digital Screening   Potassium     Requested Prescriptions    No prescriptions requested or ordered in this encounter    Return in about 7 weeks (around 06/24/2021) for PAP.  Jonah Blue, MD, FACP

## 2021-05-20 ENCOUNTER — Other Ambulatory Visit: Payer: Self-pay

## 2021-05-20 ENCOUNTER — Ambulatory Visit: Payer: BC Managed Care – PPO | Attending: Internal Medicine

## 2021-05-20 DIAGNOSIS — I10 Essential (primary) hypertension: Secondary | ICD-10-CM

## 2021-05-21 LAB — POTASSIUM: Potassium: 4.6 mmol/L (ref 3.5–5.2)

## 2021-05-25 ENCOUNTER — Telehealth: Payer: Self-pay | Admitting: Internal Medicine

## 2021-05-25 NOTE — Telephone Encounter (Signed)
Will forward to provider  

## 2021-05-25 NOTE — Telephone Encounter (Signed)
Copied from CRM [id number]. Topic: General - Other >> May 24, 2021  4:28 PM Glean Salen wrote: Reason for [id number] wants to know can prescription be sent for persistent cough she has that keeps coming back that she's had for a month. Please call back

## 2021-05-26 NOTE — Telephone Encounter (Signed)
Contacted pt to schedule a virtual appt per provider pt didn't answer lvm

## 2021-06-16 ENCOUNTER — Other Ambulatory Visit: Payer: Self-pay | Admitting: Internal Medicine

## 2021-06-16 NOTE — Telephone Encounter (Signed)
Requested medications are due for refill today.  Unknown  Requested medications are on the active medications list.  no  Last refill. 01/21/2021  Future visit scheduled.   yes  Notes to clinic.  This medication was discontinued on 04/16/2021.

## 2021-07-01 ENCOUNTER — Ambulatory Visit: Payer: BC Managed Care – PPO | Attending: Internal Medicine | Admitting: Internal Medicine

## 2021-07-01 ENCOUNTER — Other Ambulatory Visit: Payer: Self-pay

## 2021-07-01 ENCOUNTER — Other Ambulatory Visit (HOSPITAL_COMMUNITY)
Admission: RE | Admit: 2021-07-01 | Discharge: 2021-07-01 | Disposition: A | Discharge status: Home / Self Care | Payer: BC Managed Care – PPO | Source: Ambulatory Visit | Attending: Internal Medicine | Admitting: Internal Medicine

## 2021-07-01 ENCOUNTER — Encounter: Payer: Self-pay | Admitting: Internal Medicine

## 2021-07-01 VITALS — BP 151/88 | HR 55 | Resp 16 | Wt 172.6 lb

## 2021-07-01 DIAGNOSIS — Z124 Encounter for screening for malignant neoplasm of cervix: Secondary | ICD-10-CM | POA: Insufficient documentation

## 2021-07-01 DIAGNOSIS — I10 Essential (primary) hypertension: Secondary | ICD-10-CM | POA: Diagnosis not present

## 2021-07-01 NOTE — Progress Notes (Signed)
Patient ID: Kelly Arnold, female    DOB: Nov 04, 1955  MRN: [id number]  CC: Gynecologic Exam   Subjective: Kelly Arnold is a 65 y.o. female who presents for PAP Her concerns today include:  Pt with hx of HTN, obesity, LT retinal vein occlusion 11/2020, COVID infection 09/2020  HTN: Blood pressure noted to be elevated today.     Reports she has not taken HCTZ this wk.  Feels it is drying her skin.  Also noted changes in her vision "like looking through 2 lenses." Feels the HCTZ works for BP but having these side effects.  She is wanting to know whether she should be taking iron.  Last CBC done in April of this year revealed hemoglobin of 12 and hematocrit of 36 with MCV of 73.  Counts were stable when compared to CBC that was done in February 2021.   GYN History:  Pt is G4P4 Any hx of abn paps?:no Menses regular or irregular?:NA How long does menses last? Menstrual flow light or heavy?: Method of birth control?:  Any vaginal dischg at this time?: no Dysuria?: no Any hx of STI?: Sexually active with how many partners: 1, her spouse Desires STI screen: no Last [id number] already and was called.  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     B Complex-C (B-COMPLEX WITH VITAMIN C) tablet Take 1 tablet by mouth daily.     hydrochlorothiazide (HYDRODIURIL) 12.5 MG tablet Take 1 tablet (12.5 mg total) by mouth daily. 30 tablet 3   Multiple Vitamin (MULTIVITAMIN WITH MINERALS) TABS Take 1 tablet by mouth daily.     VITAMIN D PO Take by mouth.     No  current facility-administered medications on file prior to visit.    No Known Allergies  Social History   Socioeconomic History   Marital status: Married    Spouse name: Not on file   Number of children: Not on file   Years of education: Not on file   Highest education level: Not on file  Occupational History   Not on file  Tobacco Use   Smoking status: Never   Smokeless tobacco: Never  Vaping Use   Vaping Use: Never used  Substance and Sexual Activity   Alcohol use: No   Drug use: No   Sexual activity: Yes  Other Topics Concern   Not on file  Social History Narrative   Not on file   Social Determinants of Health   Financial Resource Strain: Not on file  Food Insecurity: Not on file  Transportation Needs: Not on file  Physical Activity: Not on file  Stress: Not on file  Social Connections: Not on file  Intimate Partner Violence: Not on file    Family History  Problem Relation Age of Onset   Hypertension Neg Hx    Diabetes Neg Hx    CAD Neg Hx    CVA Neg Hx    Cancer Neg Hx    Colon cancer Neg Hx    Colon polyps Neg Hx  Esophageal cancer Neg Hx    Rectal cancer Neg Hx    Stomach cancer Neg Hx     Past Surgical History:  Procedure Laterality Date   CESAREAN SECTION     x2    ROS: Review of Systems Negative except as stated above  PHYSICAL EXAM: BP (!) 151/88   Pulse (!) 55   Resp 16   Wt 172 lb 9.6 oz (78.3 kg)   SpO2 98%   BMI 29.63 kg/m   Physical Exam  General appearance - alert, well appearing, and in no distress Mental status - normal mood, behavior, speech, dress, motor activity, and thought processes Breasts -CMA Pollock present for breast and pelvic exam: Breasts appear normal, no suspicious masses, no skin or nipple changes or axillary nodes Pelvic - normal external genitalia, vulva, vagina, cervix, uterus and adnexa.  Small amount of white discharge noted around the cervix.   CMP Latest Ref Rng & Units 05/20/2021 05/02/2021  12/23/2020  Glucose 65 - 99 mg/dL - 89 90  BUN 8 - 27 mg/dL - 13 8  Creatinine 9.76 - 1.00 mg/dL - 7.34 1.93  Sodium 790 - 144 mmol/L - 139 140  Potassium 3.5 - 5.2 mmol/L 4.6 4.2 5.2  Chloride 96 - 106 mmol/L - 100 105  CO2 20 - 29 mmol/L - 25 27  Calcium 8.7 - 10.3 mg/dL - 9.8 9.7  Total Protein 6.0 - 8.5 g/dL - - 7.0  Total Bilirubin 0.0 - 1.2 mg/dL - - 0.5  Alkaline Phos 44 - 121 IU/L - - 74  AST 0 - 40 IU/L - - 24  ALT 0 - 32 IU/L - - 18   Lipid Panel     Component Value Date/Time   CHOL 220 (H) 05/02/2021 1602   TRIG 61 05/02/2021 1602   HDL 64 05/02/2021 1602   CHOLHDL 3.4 05/02/2021 1602   LDLCALC 145 (H) 05/02/2021 1602    CBC    Component Value Date/Time   WBC 4.4 12/23/2020 1155   WBC 8.0 05/12/2012 0615   RBC 4.96 12/23/2020 1155   RBC 2.99 (L) 05/12/2012 0615   HGB 12.3 12/23/2020 1155   HCT 36.4 12/23/2020 1155   PLT 231 12/23/2020 1155   MCV 73 (L) 12/23/2020 1155   MCH 24.8 (L) 12/23/2020 1155   MCH 24.4 (L) 05/12/2012 0615   MCHC 33.8 12/23/2020 1155   MCHC 31.2 05/12/2012 0615   RDW 14.8 12/23/2020 1155   LYMPHSABS 2.7 09/27/2018 1659   MONOABS 1.8 (H) 05/11/2012 0910   EOSABS 0.1 09/27/2018 1659   BASOSABS 0.0 09/27/2018 1659    ASSESSMENT AND PLAN: 1. Pap smear for cervical cancer screening -Reminded patient to call and schedule her mammogram. - Cytology - PAP  2. Essential hypertension Advised patient to stop the hydrochlorothiazide if she is having the side effects from it.  She is asking what is the best blood pressure medication to use.  I told her that depends on the patient's medical history.  We had tried her with amlodipine in the past but had to stop that because it was causing some swelling in the extremities.  Informed her that amlodipine and hydrochlorothiazide are considered first-line medications for blood pressure.  However since she is unable to tolerate either of them, we can try her with Cozaar.  Patient states she would like  to think about it and read on Cozaar before making a decision.  She will let me know.   Patient was given  the opportunity to ask questions.  Patient verbalized understanding of the plan and was able to repeat key elements of the plan.   No orders of the defined types were placed in this encounter.    Requested Prescriptions    No prescriptions requested or ordered in this encounter    No follow-ups on file.  Jonah Blue, MD, FACP

## 2021-07-01 NOTE — Patient Instructions (Signed)
I would like to stop the hydrochlorothiazide.  The medication that we can use instead is called Cozaar (Losartan).  Please let me know if you would like to give the medication a try. Do not forget to call and schedule your mammogram.

## 2021-07-05 LAB — CYTOLOGY - PAP
Comment: NEGATIVE
Diagnosis: UNDETERMINED — AB
High risk HPV: NEGATIVE

## 2021-07-08 ENCOUNTER — Telehealth: Payer: Self-pay | Admitting: Internal Medicine

## 2021-07-08 NOTE — Telephone Encounter (Signed)
Referral Request - Has patient seen PCP for this complaint? yes °*If NO, is insurance requiring patient see PCP for this issue before PCP can refer them? °Referral for which specialty: mammogram °Preferred provider/office:N/A °Reason for referral: annual mammogram ° °

## 2021-08-22 ENCOUNTER — Other Ambulatory Visit: Payer: Self-pay | Admitting: Internal Medicine

## 2021-08-22 NOTE — Telephone Encounter (Signed)
Requested medications are due for refill today.  unsure  Requested medications are on the active medications list.  yes  Last refill. 04/16/2021  Future visit scheduled.   yes  Notes to clinic.  Per note from visit of 07/01/2021 pt is to hold HCTZ. Pt was having side effects. Please advise.    Requested Prescriptions  Pending Prescriptions Disp Refills   hydrochlorothiazide (HYDRODIURIL) 12.5 MG tablet [Pharmacy Med Name: HYDROCHLOROTHIAZIDE 12.5 MG TB] 30 tablet 3    Sig: TAKE 1 TABLET BY MOUTH EVERY DAY     Cardiovascular: Diuretics - Thiazide Failed - 08/22/2021  1:35 AM      Failed - Last BP in normal range    BP Readings from Last 1 Encounters:  07/01/21 (!) 151/88          Passed - Ca in normal range and within 360 days    Calcium  Date Value Ref Range Status  05/02/2021 9.8 8.7 - 10.3 mg/dL Final          Passed - Cr in normal range and within 360 days    Creatinine, Ser  Date Value Ref Range Status  05/02/2021 0.77 0.57 - 1.00 mg/dL Final          Passed - K in normal range and within 360 days    Potassium  Date Value Ref Range Status  05/20/2021 4.6 3.5 - 5.2 mmol/L Final          Passed - Na in normal range and within 360 days    Sodium  Date Value Ref Range Status  05/02/2021 139 134 - 144 mmol/L Final          Passed - Valid encounter within last 6 months    Recent Outpatient Visits           1 month ago Pap smear for cervical cancer screening   [facility] Marcine Matar, MD   3 months ago Essential hypertension   [facility] Jonah Blue B, MD   7 months ago Elevated blood-pressure reading without diagnosis of hypertension   [facility] Marcine Matar, MD   1 year ago Splinter of finger   [facility] [place], Odette Horns, MD   1 year ago Elevated blood-pressure reading without diagnosis of hypertension   [facility] Lois Huxley, Cornelius Moras, RPH-CPP       Future Appointments             In 2 months Laural Benes, Binnie Rail, MD [facility]

## 2021-08-24 NOTE — Telephone Encounter (Signed)
Dr. Laural Benes,   Looks like patient was having side effects from HCTZ and reported these to you when you saw her in October. You talked to her about using losartan instead but she said she wanted to think about it and let you know. It doesn't look like she ever followed-up with you but she is requesting HCTZ refills. Wanted you to have a look at this before I just approved it.

## 2021-10-28 ENCOUNTER — Ambulatory Visit
Admission: RE | Admit: 2021-10-28 | Discharge: 2021-10-28 | Disposition: A | Discharge status: Home / Self Care | Payer: BC Managed Care – PPO | Source: Ambulatory Visit | Attending: Internal Medicine | Admitting: Internal Medicine

## 2021-10-28 ENCOUNTER — Other Ambulatory Visit: Payer: Self-pay

## 2021-10-28 DIAGNOSIS — Z1231 Encounter for screening mammogram for malignant neoplasm of breast: Secondary | ICD-10-CM

## 2021-11-01 ENCOUNTER — Other Ambulatory Visit: Payer: Self-pay | Admitting: Internal Medicine

## 2021-11-01 DIAGNOSIS — R928 Other abnormal and inconclusive findings on diagnostic imaging of breast: Secondary | ICD-10-CM

## 2021-11-04 ENCOUNTER — Ambulatory Visit: Payer: BC Managed Care – PPO | Admitting: Internal Medicine

## 2021-11-09 ENCOUNTER — Ambulatory Visit: Payer: BC Managed Care – PPO | Attending: Internal Medicine | Admitting: Internal Medicine

## 2021-11-09 ENCOUNTER — Other Ambulatory Visit: Payer: Self-pay

## 2021-11-09 ENCOUNTER — Encounter: Payer: Self-pay | Admitting: Internal Medicine

## 2021-11-09 VITALS — BP 133/82 | HR 73 | Resp 16 | Wt 168.8 lb

## 2021-11-09 DIAGNOSIS — E663 Overweight: Secondary | ICD-10-CM

## 2021-11-09 DIAGNOSIS — R928 Other abnormal and inconclusive findings on diagnostic imaging of breast: Secondary | ICD-10-CM | POA: Diagnosis not present

## 2021-11-09 DIAGNOSIS — H579 Unspecified disorder of eye and adnexa: Secondary | ICD-10-CM

## 2021-11-09 DIAGNOSIS — I10 Essential (primary) hypertension: Secondary | ICD-10-CM

## 2021-11-09 DIAGNOSIS — E559 Vitamin D deficiency, unspecified: Secondary | ICD-10-CM | POA: Diagnosis not present

## 2021-11-09 NOTE — Patient Instructions (Signed)
Continue to monitor your blood pressure off medication.  Goal is 130/80 or lower. ? ?Keep up the good works with healthy eating habits and regular exercise.   ?

## 2021-11-09 NOTE — Progress Notes (Signed)
? ? ?Patient ID: Kelly Arnold, female    DOB: [birth year]  MRN: [id number] ? ?CC: chronic ds management ? ?Subjective: ?Kelly Arnold is a 66 y.o. female who presents for chronic ds management ?Her concerns today include:  ?Pt with hx of HTN, obesity, LT retinal vein occlusion 11/2020, COVID infection 09/2020 ? ?She stopped the HCTZ 3-4 wks ago. ?She was worried about her eyes.  She felt each time she took it, it caused some issues with her eyes. ?Started taking some herbal tea over the counter ?Takes BP daily every morning since stopping it.  SBP has been in the 120s and DBP in the 80-87 range.   ?Manges salt in take.   Getting in fruits and veggies daily.   ?Walks every other day.   ?Wgh slowly coming down from 180 lbs in 08/2020 to 168 lbs today.  ?Would like vit D level checked.  Hx of Vit D def.  Currently takes a MV tab 2-3 times a wk.   ? ?Had MMG 10/28/2021.  Radiologist saw mass in RT.  She has upcoming appointment for more images. ? ?HM:  had 2 COVID vaccines.  Does not want any more shots for this. Had COVID x 2.  Declines shingles vaccine.   ?Patient Active Problem List  ? Diagnosis Date Noted  ? Branch retinal vein occlusion with macular edema of left eye 12/31/2020  ? Obesity (BMI 30.0-34.9) 12/31/2020  ? Abnormal EKG 12/31/2020  ? Normocytic anemia 05/11/2012  ? Sinus tachycardia 05/11/2012  ?  ? ?Current Outpatient Medications on File Prior to Visit  ?Medication Sig Dispense Refill  ? Ascorbic Acid (VITAMIN C PO) Take by mouth.    ? B Complex-C (B-COMPLEX WITH VITAMIN C) tablet Take 1 tablet by mouth daily.    ? Multiple Vitamin (MULTIVITAMIN WITH MINERALS) TABS Take 1 tablet by mouth daily.    ? VITAMIN D PO Take by mouth.    ? ?No current facility-administered medications on file prior to visit.  ? ? ?No Known Allergies ? ?Social History  ? ?Socioeconomic History  ? Marital status: Married  ?  Spouse name: Not on file  ? Number of children: Not on file  ? Years of education: Not on file  ? Highest  education level: Not on file  ?Occupational History  ? Not on file  ?Tobacco Use  ? Smoking status: Never  ? Smokeless tobacco: Never  ?Vaping Use  ? Vaping Use: Never used  ?Substance and Sexual Activity  ? Alcohol use: No  ? Drug use: No  ? Sexual activity: Yes  ?Other Topics Concern  ? Not on file  ?Social History Narrative  ? Not on file  ? ?Social Determinants of Health  ? ?Financial Resource Strain: Not on file  ?Food Insecurity: Not on file  ?Transportation Needs: Not on file  ?Physical Activity: Not on file  ?Stress: Not on file  ?Social Connections: Not on file  ?Intimate Partner Violence: Not on file  ? ? ?Family History  ?Problem Relation Age of Onset  ? Hypertension Neg Hx   ? Diabetes Neg Hx   ? CAD Neg Hx   ? CVA Neg Hx   ? Cancer Neg Hx   ? Colon cancer Neg Hx   ? Colon polyps Neg Hx   ? Esophageal cancer Neg Hx   ? Rectal cancer Neg Hx   ? Stomach cancer Neg Hx   ? ? ?Past Surgical History:  ?Procedure Laterality Date  ? CESAREAN  SECTION    ? x2  ? ? ?ROS: ?Review of Systems  ?HENT:  Negative for hearing loss and sore throat.   ?Eyes:   ?     I note that she has a small mass on the inner aspect of the right eye and 2 on the  outer aspect of the left eye..  Patient states she has had these intermittently for the past several years.  She has seen a dermatologist for them before.  The 1 on the right eye the dermatologist stuck a needle in it and drained it.  However after several months it started coming back again.  ?Respiratory:  Negative for chest tightness and shortness of breath.   ?Cardiovascular:  Negative for chest pain.  ?Negative except as stated above ? ?PHYSICAL EXAM: ?BP 133/82   Pulse 73   Resp 16   Wt 168 lb 12.8 oz (76.6 kg)   SpO2 96%   BMI 28.97 kg/m?   ?Wt Readings from Last 3 Encounters:  ?11/09/21 168 lb 12.8 oz (76.6 kg)  ?07/01/21 172 lb 9.6 oz (78.3 kg)  ?05/06/21 174 lb 3.2 oz (79 kg)  ? ? ?Physical Exam ? ?General appearance - alert, well appearing, and in no  distress ?Mental status - normal mood, behavior, speech, dress, motor activity, and thought processes ?Eyes -patient with soft pea-sized mass on the medial aspect of the right eye and 2 smaller ones noted on the lateral aspect of the left eye. ?Neck - supple, no significant adenopathy ?Chest - clear to auscultation, no wheezes, rales or rhonchi, symmetric air entry ?Heart - normal rate, regular rhythm, normal S1, S2, no murmurs, rubs, clicks or gallops ?Extremities - peripheral pulses normal, no pedal edema, no clubbing or cyanosis ? ? ?CMP Latest Ref Rng & Units 05/20/2021 05/02/2021 12/23/2020  ?Glucose 65 - 99 mg/dL - 89 90  ?BUN 8 - 27 mg/dL - 13 8  ?Creatinine 0.57 - 1.00 mg/dL - 7.51 7.00  ?Sodium 134 - 144 mmol/L - 139 140  ?Potassium 3.5 - 5.2 mmol/L 4.6 4.2 5.2  ?Chloride 96 - 106 mmol/L - 100 105  ?CO2 20 - 29 mmol/L - 25 27  ?Calcium 8.7 - 10.3 mg/dL - 9.8 9.7  ?Total Protein 6.0 - 8.5 g/dL - - 7.0  ?Total Bilirubin 0.0 - 1.2 mg/dL - - 0.5  ?Alkaline Phos 44 - 121 IU/L - - 74  ?AST 0 - 40 IU/L - - 24  ?ALT 0 - 32 IU/L - - 18  ? ?Lipid Panel  ?   ?Component Value Date/Time  ? CHOL 220 (H) 05/02/2021 1602  ? TRIG 61 05/02/2021 1602  ? HDL 64 05/02/2021 1602  ? CHOLHDL 3.4 05/02/2021 1602  ? LDLCALC 145 (H) 05/02/2021 1602  ? ? ?CBC ?   ?Component Value Date/Time  ? WBC 4.4 12/23/2020 1155  ? WBC 8.0 05/12/2012 0615  ? RBC 4.96 12/23/2020 1155  ? RBC 2.99 (L) 05/12/2012 0615  ? HGB 12.3 12/23/2020 1155  ? HCT 36.4 12/23/2020 1155  ? PLT 231 12/23/2020 1155  ? MCV 73 (L) 12/23/2020 1155  ? MCH 24.8 (L) 12/23/2020 1155  ? MCH 24.4 (L) 05/12/2012 0615  ? MCHC 33.8 12/23/2020 1155  ? MCHC 31.2 05/12/2012 0615  ? RDW 14.8 12/23/2020 1155  ? LYMPHSABS 2.7 09/27/2018 1659  ? MONOABS 1.8 (H) 05/11/2012 0910  ? EOSABS 0.1 09/27/2018 1659  ? BASOSABS 0.0 09/27/2018 1659  ? ? ?ASSESSMENT AND PLAN: ?1. Essential hypertension ?  Close to goal of 130/80 or lower. ?Patient will remain off HCTZ.  We agree not to start any other  blood pressure medication.  She will continue to monitor her blood pressure at home. ?- Comprehensive metabolic panel ?- CBC ? ?2. Overweight (BMI 25.0-29.9) ?Commended her on her efforts to eat healthy and to exercise regularly.  Also commended her on weight loss so far.  Encouraged her to keep up the good works. ?- Lipid panel ? ?3. Abnormal mammogram of right breast ?-Advised patient to keep upcoming appointment for additional images of the right breast.  Radiologist will determine whether the area of concern is suspicious enough to warrant a biopsy versus 40-month follow-up versus normal exam. ? ?4. Vitamin D deficiency ?- VITAMIN D 25 Hydroxy (Vit-D Deficiency, Fractures) ? ?5. Eye lesion ?Recommend that she sees an ophthalmologist to have these lesions evaluated and possibly permanently removed. ? ? ? ?Patient was given the opportunity to ask questions.  Patient verbalized understanding of the plan and was able to repeat key elements of the plan.  ? ?This documentation was completed using Paediatric nurse.  Any transcriptional errors are unintentional. ? ?Orders Placed This Encounter  ?Procedures  ? Lipid panel  ? Comprehensive metabolic panel  ? CBC  ? VITAMIN D 25 Hydroxy (Vit-D Deficiency, Fractures)  ? ? ? ?Requested Prescriptions  ? ? No prescriptions requested or ordered in this encounter  ? ? ?Return in about 4 months (around 03/11/2022). ? ?Jonah Blue, MD, FACP ?

## 2021-11-10 LAB — COMPREHENSIVE METABOLIC PANEL
ALT: 15 IU/L (ref 0–32)
AST: 19 IU/L (ref 0–40)
Albumin/Globulin Ratio: 1.6 (ref 1.2–2.2)
Albumin: 4.4 g/dL (ref 3.8–4.8)
Alkaline Phosphatase: 72 IU/L (ref 44–121)
BUN/Creatinine Ratio: 16 (ref 12–28)
BUN: 11 mg/dL (ref 8–27)
Bilirubin Total: 0.2 mg/dL (ref 0.0–1.2)
CO2: 24 mmol/L (ref 20–29)
Calcium: 9.3 mg/dL (ref 8.7–10.3)
Chloride: 104 mmol/L (ref 96–106)
Creatinine, Ser: 0.69 mg/dL (ref 0.57–1.00)
Globulin, Total: 2.8 g/dL (ref 1.5–4.5)
Glucose: 85 mg/dL (ref 70–99)
Potassium: 4.4 mmol/L (ref 3.5–5.2)
Sodium: 140 mmol/L (ref 134–144)
Total Protein: 7.2 g/dL (ref 6.0–8.5)
eGFR: 97 mL/min/{1.73_m2} (ref >59)

## 2021-11-10 LAB — LIPID PANEL
Chol/HDL Ratio: 2.7 ratio (ref 0.0–4.4)
Cholesterol, Total: 216 mg/dL — ABNORMAL HIGH (ref 100–199)
HDL: 81 mg/dL (ref >39)
LDL Chol Calc (NIH): 118 mg/dL — ABNORMAL HIGH (ref 0–99)
Triglycerides: 100 mg/dL (ref 0–149)
VLDL Cholesterol Cal: 17 mg/dL (ref 5–40)

## 2021-11-10 LAB — CBC
Hematocrit: 37.3 % (ref 34.0–46.6)
Hemoglobin: 12.4 g/dL (ref 11.1–15.9)
MCH: 25.1 pg — ABNORMAL LOW (ref 26.6–33.0)
MCHC: 33.2 g/dL (ref 31.5–35.7)
MCV: 75 fL — ABNORMAL LOW (ref 79–97)
Platelets: 231 10*3/uL (ref 150–450)
RBC: 4.95 x10E6/uL (ref 3.77–5.28)
RDW: 14 % (ref 11.7–15.4)
WBC: 4.8 10*3/uL (ref 3.4–10.8)

## 2021-11-10 LAB — VITAMIN D 25 HYDROXY (VIT D DEFICIENCY, FRACTURES): Vit D, 25-Hydroxy: 38.8 ng/mL (ref 30.0–100.0)

## 2021-11-15 ENCOUNTER — Ambulatory Visit
Admission: RE | Admit: 2021-11-15 | Discharge: 2021-11-15 | Disposition: A | Discharge status: Home / Self Care | Payer: BC Managed Care – PPO | Source: Ambulatory Visit | Attending: Internal Medicine | Admitting: Internal Medicine

## 2021-11-15 DIAGNOSIS — R928 Other abnormal and inconclusive findings on diagnostic imaging of breast: Secondary | ICD-10-CM

## 2021-11-17 ENCOUNTER — Encounter: Payer: Self-pay | Admitting: Internal Medicine

## 2021-11-17 DIAGNOSIS — R718 Other abnormality of red blood cells: Secondary | ICD-10-CM

## 2021-12-16 ENCOUNTER — Ambulatory Visit: Payer: BC Managed Care – PPO

## 2021-12-19 ENCOUNTER — Ambulatory Visit: Payer: BC Managed Care – PPO | Attending: Internal Medicine

## 2021-12-19 DIAGNOSIS — R718 Other abnormality of red blood cells: Secondary | ICD-10-CM

## 2021-12-20 DIAGNOSIS — H34832 Tributary (branch) retinal vein occlusion, left eye, with macular edema: Secondary | ICD-10-CM | POA: Insufficient documentation

## 2021-12-20 DIAGNOSIS — H3581 Retinal edema: Secondary | ICD-10-CM | POA: Insufficient documentation

## 2021-12-20 DIAGNOSIS — H2513 Age-related nuclear cataract, bilateral: Secondary | ICD-10-CM | POA: Insufficient documentation

## 2021-12-20 DIAGNOSIS — H35033 Hypertensive retinopathy, bilateral: Secondary | ICD-10-CM | POA: Insufficient documentation

## 2021-12-21 LAB — HGB FRACTIONATION CASCADE
Hgb A2: 3.4 % — ABNORMAL HIGH (ref 1.8–3.2)
Hgb A: 63 % — ABNORMAL LOW (ref 96.4–98.8)
Hgb F: 0 % (ref 0.0–2.0)
Hgb S: 33.6 % — ABNORMAL HIGH

## 2021-12-21 LAB — HGB SOLUBILITY: Hgb Solubility: POSITIVE — AB

## 2022-03-17 ENCOUNTER — Ambulatory Visit: Payer: BC Managed Care – PPO | Attending: Internal Medicine | Admitting: Internal Medicine

## 2022-03-17 VITALS — BP 125/75 | HR 59 | Temp 98.0°F | Ht 64.0 in | Wt 172.0 lb

## 2022-03-17 DIAGNOSIS — E663 Overweight: Secondary | ICD-10-CM | POA: Diagnosis not present

## 2022-03-17 DIAGNOSIS — E559 Vitamin D deficiency, unspecified: Secondary | ICD-10-CM | POA: Diagnosis not present

## 2022-03-17 DIAGNOSIS — D573 Sickle-cell trait: Secondary | ICD-10-CM | POA: Diagnosis not present

## 2022-03-17 DIAGNOSIS — E782 Mixed hyperlipidemia: Secondary | ICD-10-CM

## 2022-03-17 DIAGNOSIS — R9431 Abnormal electrocardiogram [ECG] [EKG]: Secondary | ICD-10-CM

## 2022-03-17 DIAGNOSIS — Z8679 Personal history of other diseases of the circulatory system: Secondary | ICD-10-CM

## 2022-03-17 NOTE — Progress Notes (Signed)
Patient ID: Kelly Arnold, female    DOB: 05/21/1956  MRN: [id number]  CC: chronic ds management   Subjective: Kelly Arnold is a 66 y.o. female who presents for chronic ds management Her concerns today include:  Pt with hx of HTN, obesity, LT retinal vein occlusion 11/2020, COVID infection 09/2020, [place] trait  Patient continues to check blood pressure at home regularly.  Reports her range has been in the 120s/75-81.  She has remained off HCTZ.  She walks daily for an hour or more.  Eats fairly healthy.  Recent weight at home has been 169 pounds. She is requesting to have vitamin D level rechecked.  History of vitamin D deficiency.  She is taking vitamin D supplement over-the-counter 2000 IU several days a week.  Some days when she wakes up she feels tired even though she has slept well through the night.  She does not think she snores and her husband has never told her that she snores.  No morning headaches or daytime sleepiness.  HL: Would like to have cholesterol rechecked.  LDL on last visit in March was 118.  Previously it was 145.  Patient with low normal hemoglobin level but low MCV.  We did a hemoglobin electrophoresis and it confirms that she has sickle cell trait.  She has 2 children who have sickle cell.  She is wondering whether she needs to see cardiology.  I had referred her to cardiology 12/2020 for abnormal EKG that was done earlier that month at an urgent care visit.  According to my note, that EKG showed poor R wave progression with new T wave inversions in the inferior and lateral leads when compared to EKG that was done in 2013.  Patient was called for appointment but looks like she never called back to schedule. -Her concern is whether the retinal vein occlusion that she had 11/2020 may have had some bearing on her heart.  At that time patient was diagnosed with COVID and was on Molnupiravir. -Denies any chest pains with exertion.  She is very active walking an hour daily.   No shortness of breath on exertion.  She does not smoke, does not have diabetes, no family history of early heart disease in first-degree relatives.  Patient Active Problem List   Diagnosis Date Noted   Vitamin D deficiency 11/09/2021   Branch retinal vein occlusion with macular edema of left eye 12/31/2020   Obesity (BMI 30.0-34.9) 12/31/2020   Abnormal EKG 12/31/2020   Normocytic anemia 05/11/2012   Sinus tachycardia 05/11/2012     Current Outpatient Medications on File Prior to Visit  Medication Sig Dispense Refill   Ascorbic Acid (VITAMIN C PO) Take by mouth.     B Complex-C (B-COMPLEX WITH VITAMIN C) tablet Take 1 tablet by mouth daily.     Multiple Vitamin (MULTIVITAMIN WITH MINERALS) TABS Take 1 tablet by mouth daily.     VITAMIN D PO Take by mouth.     No current facility-administered medications on file prior to visit.    No Known Allergies  Social History   Socioeconomic History   Marital status: Married    Spouse name: Not on file   Number of children: Not on file   Years of education: Not on file   Highest education level: Not on file  Occupational History   Not on file  Tobacco Use   Smoking status: Never   Smokeless tobacco: Never  Vaping Use   Vaping Use: Never used  Substance and Sexual Activity   Alcohol use: No   Drug use: No   Sexual activity: Yes  Other Topics Concern   Not on file  Social History Narrative   Not on file   Social Determinants of Health   Financial Resource Strain: Not on file  Food Insecurity: Not on file  Transportation Needs: Not on file  Physical Activity: Not on file  Stress: Not on file  Social Connections: Not on file  Intimate Partner Violence: Not on file    Family History  Problem Relation Age of Onset   Hypertension Neg Hx    Diabetes Neg Hx    CAD Neg Hx    CVA Neg Hx    Cancer Neg Hx    Colon cancer Neg Hx    Colon polyps Neg Hx    Esophageal cancer Neg Hx    Rectal cancer Neg Hx    Stomach cancer  Neg Hx     Past Surgical History:  Procedure Laterality Date   CESAREAN SECTION     x2    ROS: Review of Systems Negative except as stated above  PHYSICAL EXAM: BP 125/75   Pulse (!) 59   Temp 98 F (36.7 C) (Oral)   Ht 5\' 4"  (1.626 m)   Wt 172 lb (78 kg)   SpO2 99%   BMI 29.52 kg/m   Wt Readings from Last 3 Encounters:  03/17/22 172 lb (78 kg)  11/09/21 168 lb 12.8 oz (76.6 kg)  07/01/21 172 lb 9.6 oz (78.3 kg)    Physical Exam  General appearance - alert, well appearing, and in no distress Mental status - normal mood, behavior, speech, dress, motor activity, and thought processes Neck - supple, no significant adenopathy Chest - clear to auscultation, no wheezes, rales or rhonchi, symmetric air entry Heart - normal rate, regular rhythm, normal S1, S2, no murmurs, rubs, clicks or gallops Extremities - peripheral pulses normal, no pedal edema, no clubbing or cyanosis      Latest Ref Rng & Units 11/09/2021    4:41 PM 05/20/2021    4:10 PM 05/02/2021    4:02 PM  CMP  Glucose 70 - 99 mg/dL 85   89   BUN 8 - 27 mg/dL 11   13   Creatinine 05/04/2021 - 1.00 mg/dL 0.16   0.10   Sodium 9.32 - 144 mmol/L 140   139   Potassium 3.5 - 5.2 mmol/L 4.4  4.6  4.2   Chloride 96 - 106 mmol/L 104   100   CO2 20 - 29 mmol/L 24   25   Calcium 8.7 - 10.3 mg/dL 9.3   9.8   Total Protein 6.0 - 8.5 g/dL 7.2     Total Bilirubin 0.0 - 1.2 mg/dL 0.2     Alkaline Phos 44 - 121 IU/L 72     AST 0 - 40 IU/L 19     ALT 0 - 32 IU/L 15      Lipid Panel     Component Value Date/Time   CHOL 216 (H) 11/09/2021 1641   TRIG 100 11/09/2021 1641   HDL 81 11/09/2021 1641   CHOLHDL 2.7 11/09/2021 1641   LDLCALC 118 (H) 11/09/2021 1641    CBC    Component Value Date/Time   WBC 4.8 11/09/2021 1641   WBC 8.0 05/12/2012 0615   RBC 4.95 11/09/2021 1641   RBC 2.99 (L) 05/12/2012 0615   HGB 12.4 11/09/2021 1641   HCT  37.3 11/09/2021 1641   PLT 231 11/09/2021 1641   MCV 75 (L) 11/09/2021 1641   MCH  25.1 (L) 11/09/2021 1641   MCH 24.4 (L) 05/12/2012 0615   MCHC 33.2 11/09/2021 1641   MCHC 31.2 05/12/2012 0615   RDW 14.0 11/09/2021 1641   LYMPHSABS 2.7 09/27/2018 1659   MONOABS 1.8 (H) 05/11/2012 0910   EOSABS 0.1 09/27/2018 1659   BASOSABS 0.0 09/27/2018 1659    ASSESSMENT AND PLAN: 1. Mixed hyperlipidemia LDL has improved but not at goal.  Encouraged her to continue healthy eating habits and regular exercise.  Recheck lipid profile today per her request. - Lipid panel  2. Overweight (BMI 25.0-29.9) Continue healthy eating habits and regular exercise  3. Vitamin D deficiency - VITAMIN D 25 Hydroxy (Vit-D Deficiency, Fractures)  4. Sickle cell trait ([place]) Patient asymptomatic.  Hemoglobin has remained stable over the past several years.  5. History of retinal vein occlusion Given that she had acute COVID infection going on at the time, likely could have been due to COVID infection.  Informed patient that during the height of the pandemic we were seeing a lot of embolic strokes, PE, DVT in association with COVID infection.  6. Abnormal EKG I think likelihood of her having significant coronary artery disease is low as she is asymptomatic.  She reports no chest pains with exercise.  She is a non-smoker.  She does have mild elevation in cholesterol but that is improving.  Told her that I do not think she needs to see a cardiologist but if she would like to see one I have no issues with referring her.  She can let me know.   I spent 30 minutes on this encounter today which included review of previous office notes prior to the visit, face-to-face time with patient doing physical exam and discussing diagnosis and management.  Patient was given the opportunity to ask questions.  Patient verbalized understanding of the plan and was able to repeat key elements of the plan.   This documentation was completed using Radio producer.  Any transcriptional errors are  unintentional.  Orders Placed This Encounter  Procedures   VITAMIN D 25 Hydroxy (Vit-D Deficiency, Fractures)   Lipid panel     Requested Prescriptions    No prescriptions requested or ordered in this encounter    Return in about 4 months (around 07/18/2022).  Karle Plumber, MD, FACP

## 2022-03-18 LAB — LIPID PANEL
Chol/HDL Ratio: 2.8 ratio (ref 0.0–4.4)
Cholesterol, Total: 228 mg/dL — ABNORMAL HIGH (ref 100–199)
HDL: 82 mg/dL (ref >39)
LDL Chol Calc (NIH): 127 mg/dL — ABNORMAL HIGH (ref 0–99)
Triglycerides: 111 mg/dL (ref 0–149)
VLDL Cholesterol Cal: 19 mg/dL (ref 5–40)

## 2022-03-18 LAB — VITAMIN D 25 HYDROXY (VIT D DEFICIENCY, FRACTURES): Vit D, 25-Hydroxy: 28.5 ng/mL — ABNORMAL LOW (ref 30.0–100.0)

## 2022-04-22 IMAGING — US US BREAST*R* LIMITED INC AXILLA
1 series · 7 of 7 positions shown · non-contrast
Comparison: Previous exams including recent screening mammogram
dated 10/28/2021.

CLINICAL DATA: Patient returns today to evaluate a possible RIGHT
breast mass questioned on recent screening mammogram.

EXAM:
DIGITAL DIAGNOSTIC UNILATERAL RIGHT MAMMOGRAM WITH TOMOSYNTHESIS AND
CAD; ULTRASOUND RIGHT BREAST LIMITED
TECHNIQUE: Right digital diagnostic mammography and breast tomosynthesis was
performed. The images were evaluated with computer-aided detection.;
Targeted ultrasound examination of the right breast was performed

[Series 1: us breast*right* limited inc axilla · 0.06mm/px · 7 of 7 slices shown]
[im 1/7]
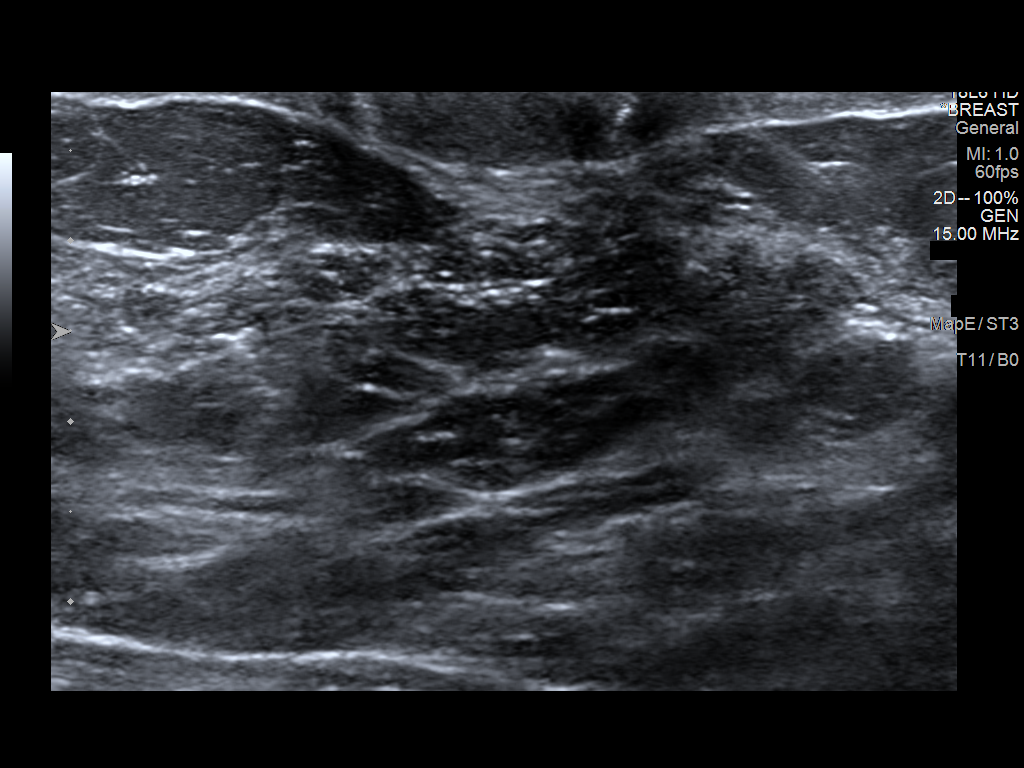
[im 2/7]
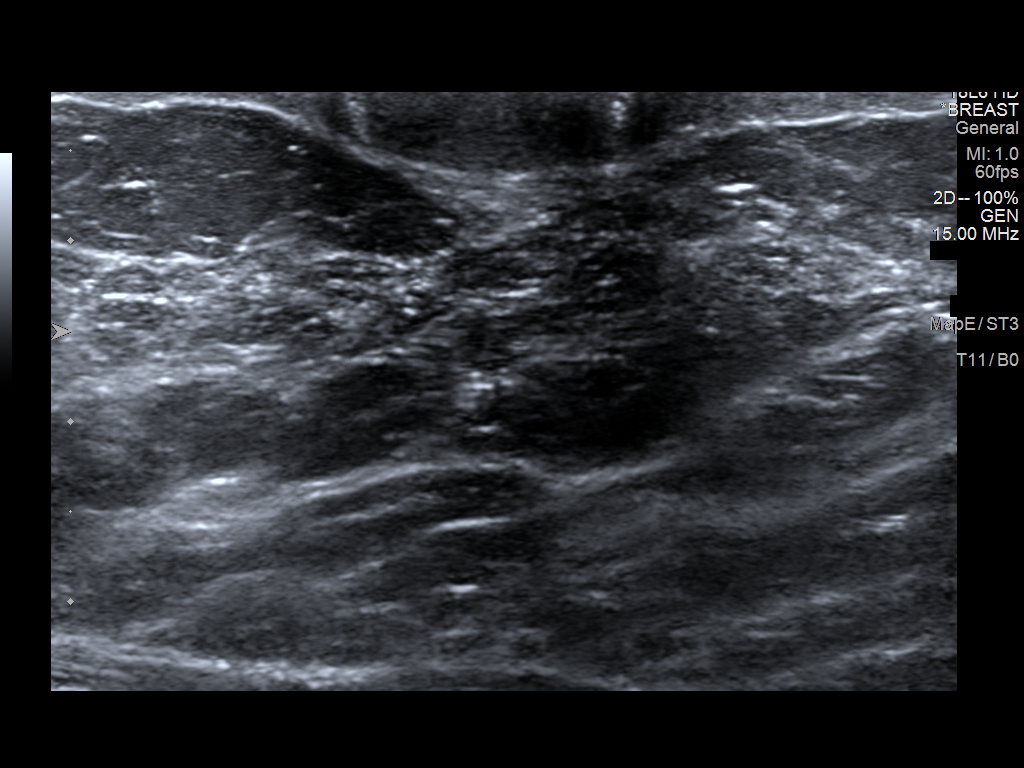
[im 3/7]
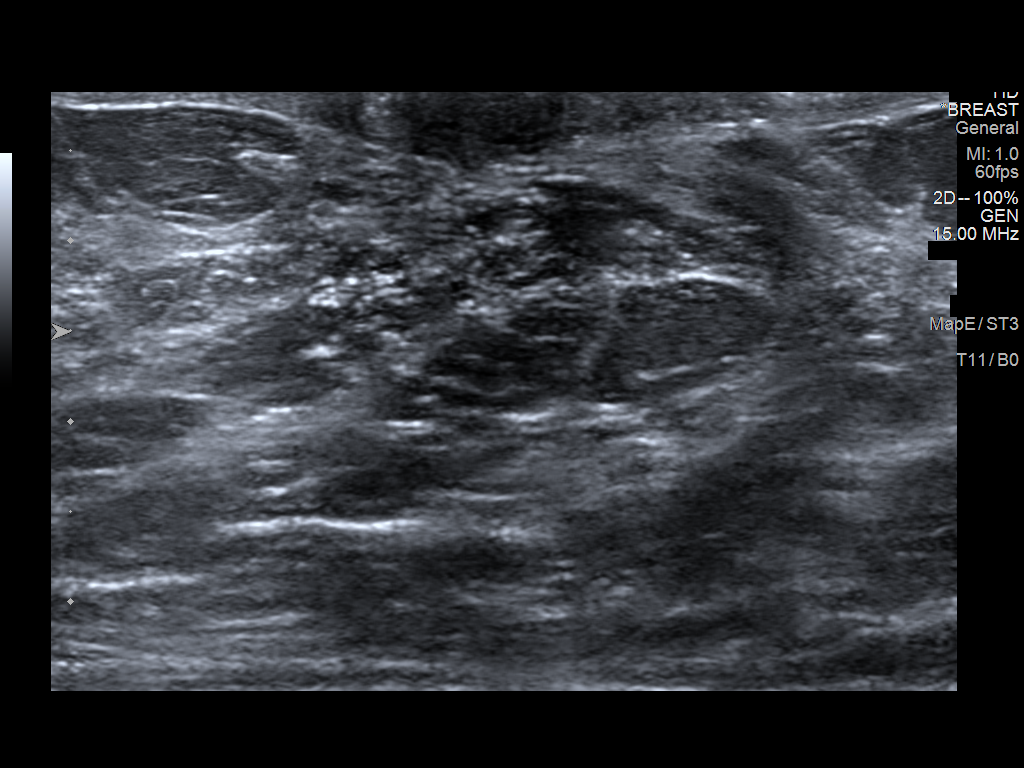
[im 4/7]
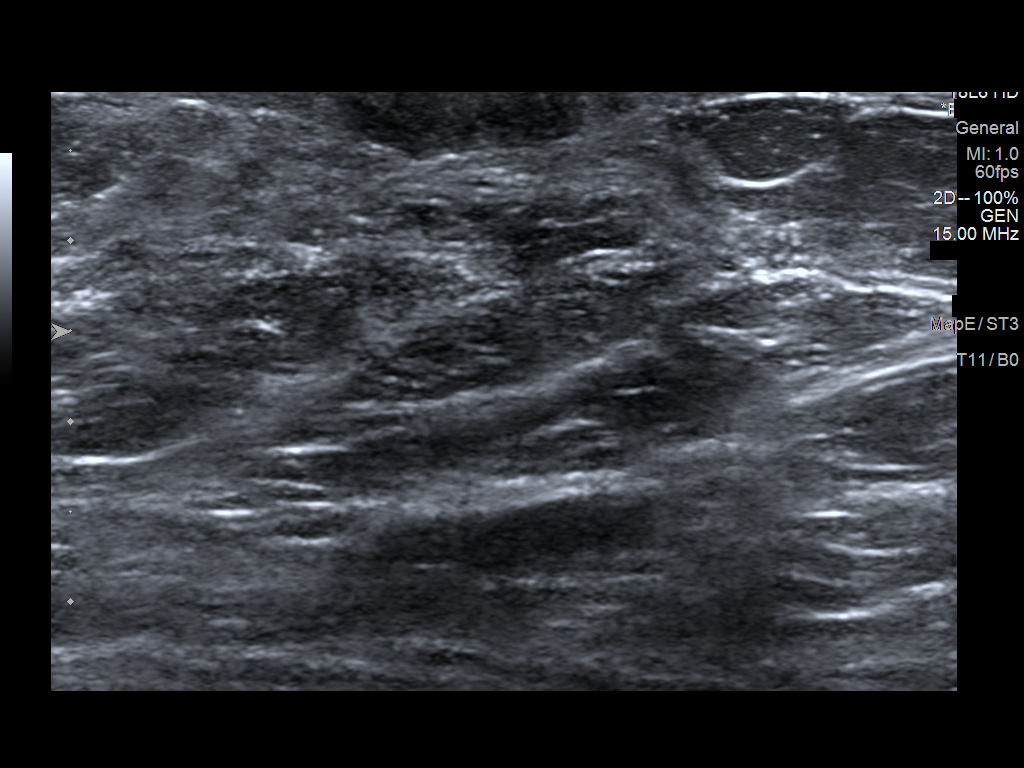
[im 5/7]
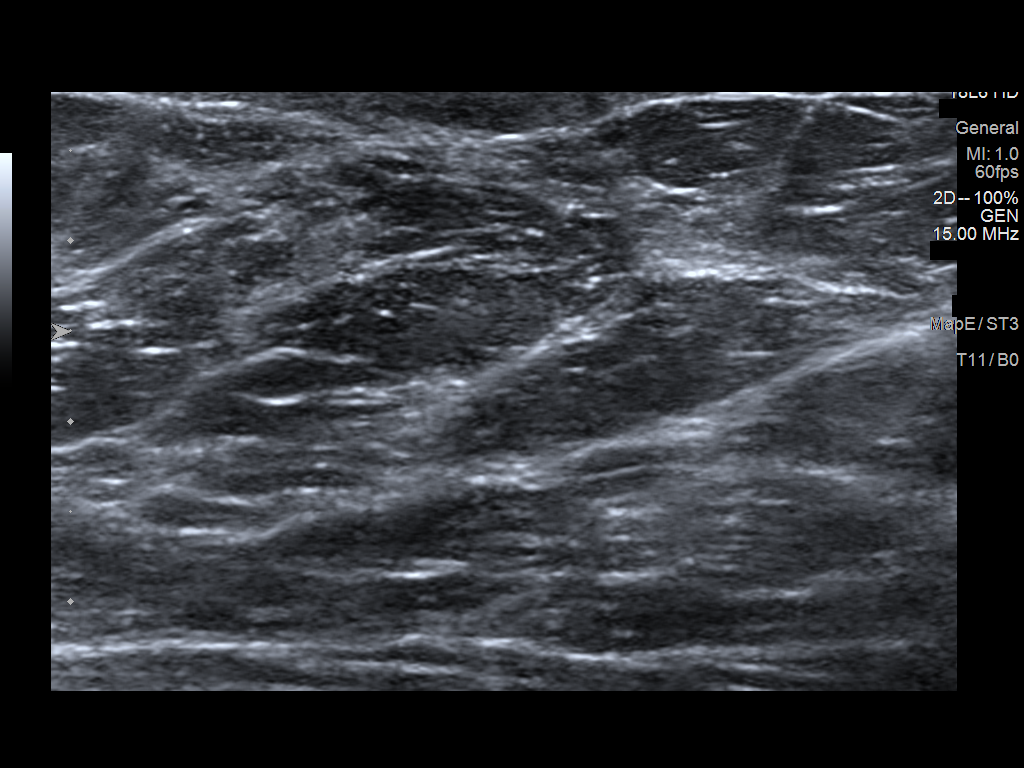
[im 6/7]
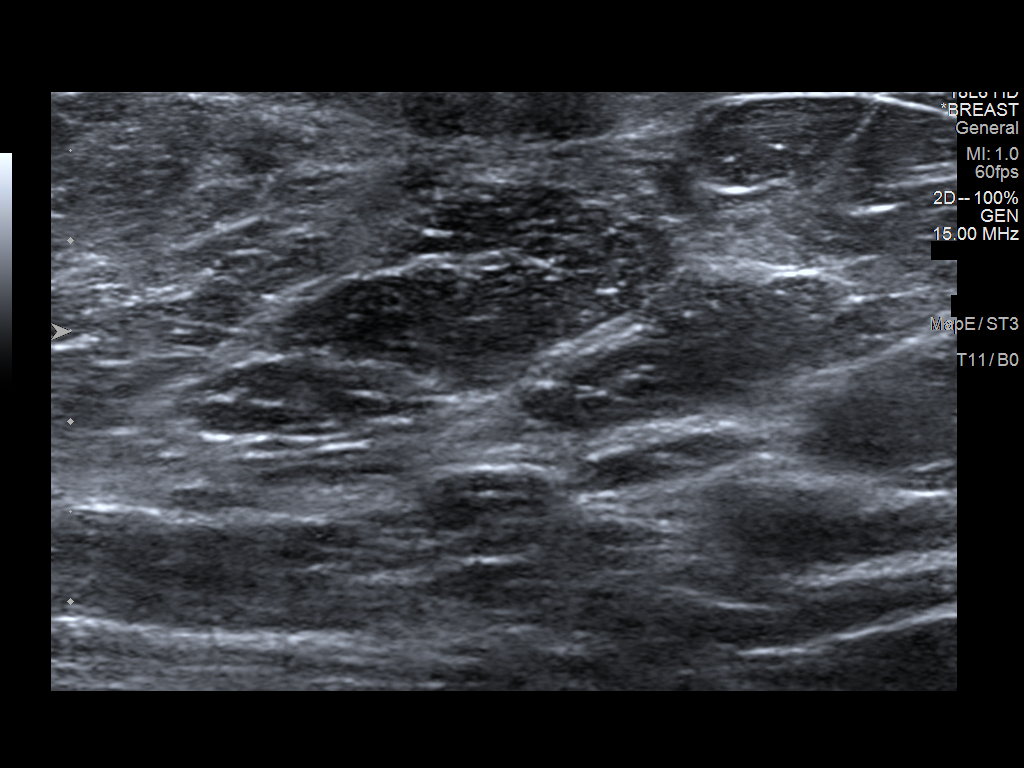
[im 7/7]
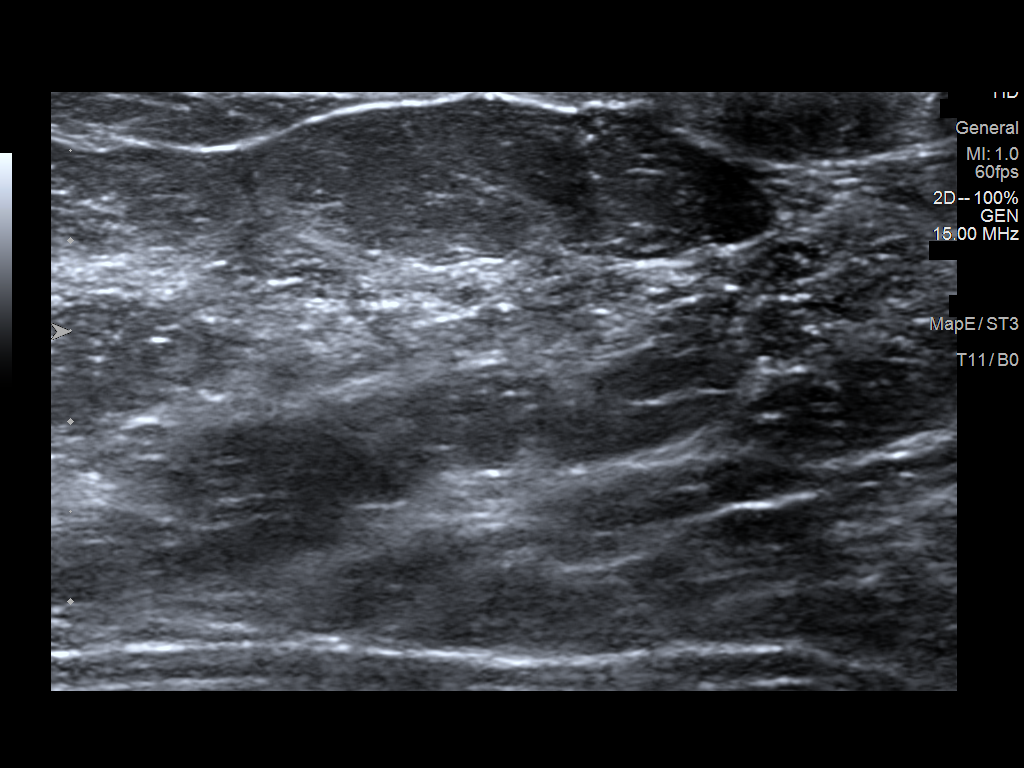

[7 of 7 positions shown; findings below may reference images not displayed]

ACR Breast Density Category b: There are scattered areas of
fibroglandular density.
FINDINGS: On today's additional diagnostic views, the questioned mass within
the retroareolar RIGHT breast is compatible with a
superimposition/confluence of normal caliber subareolar milk ducts.

Targeted ultrasound is performed, evaluating the retroareolar RIGHT
breast, showing only normal breast tissues. No solid or cystic mass.
No abnormal-appearing milk ducts.
IMPRESSION: No evidence of malignancy.

Patient may return to routine annual bilateral screening mammogram
schedule.

RECOMMENDATION:
Screening mammogram in one year.(Code:XJ-[id number])

I have discussed the findings and recommendations with the patient.
If applicable, a reminder letter will be sent to the patient
regarding the next appointment.

BI-RADS CATEGORY  1: Negative.

## 2022-04-22 IMAGING — MG MM DIGITAL DIAGNOSTIC UNILAT*R* W/ TOMO W/ CAD
4 series · 4 of 12 positions shown · non-contrast
Comparison: Previous exams including recent screening mammogram
dated 10/28/2021.

CLINICAL DATA: Patient returns today to evaluate a possible RIGHT
breast mass questioned on recent screening mammogram.

EXAM:
DIGITAL DIAGNOSTIC UNILATERAL RIGHT MAMMOGRAM WITH TOMOSYNTHESIS AND
CAD; ULTRASOUND RIGHT BREAST LIMITED
TECHNIQUE: Right digital diagnostic mammography and breast tomosynthesis was
performed. The images were evaluated with computer-aided detection.;
Targeted ultrasound examination of the right breast was performed

[R ML synth-2D]
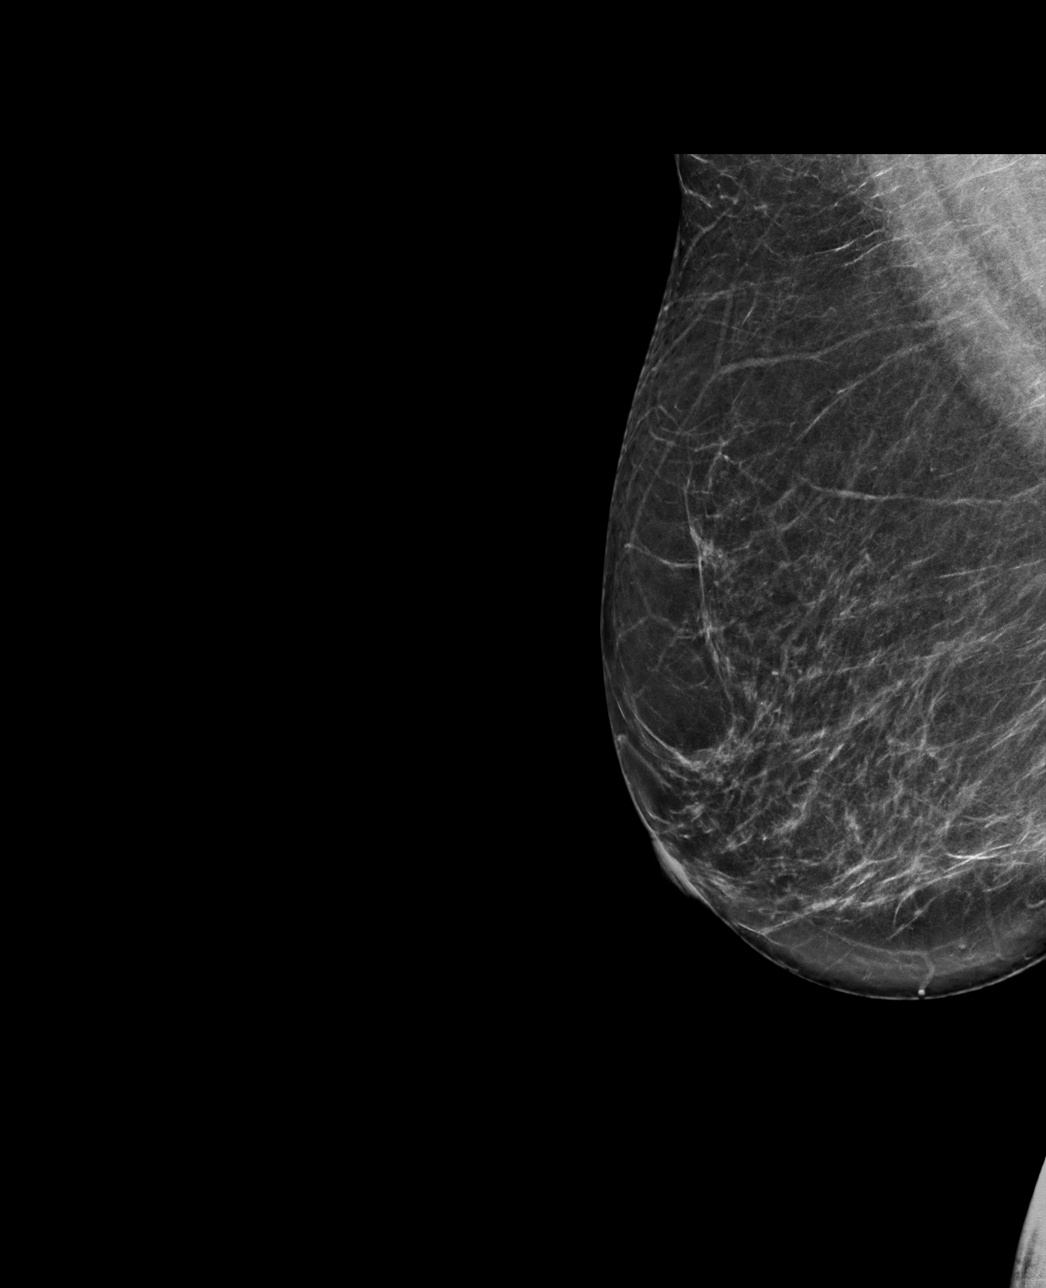

[R MLO synth-2D]
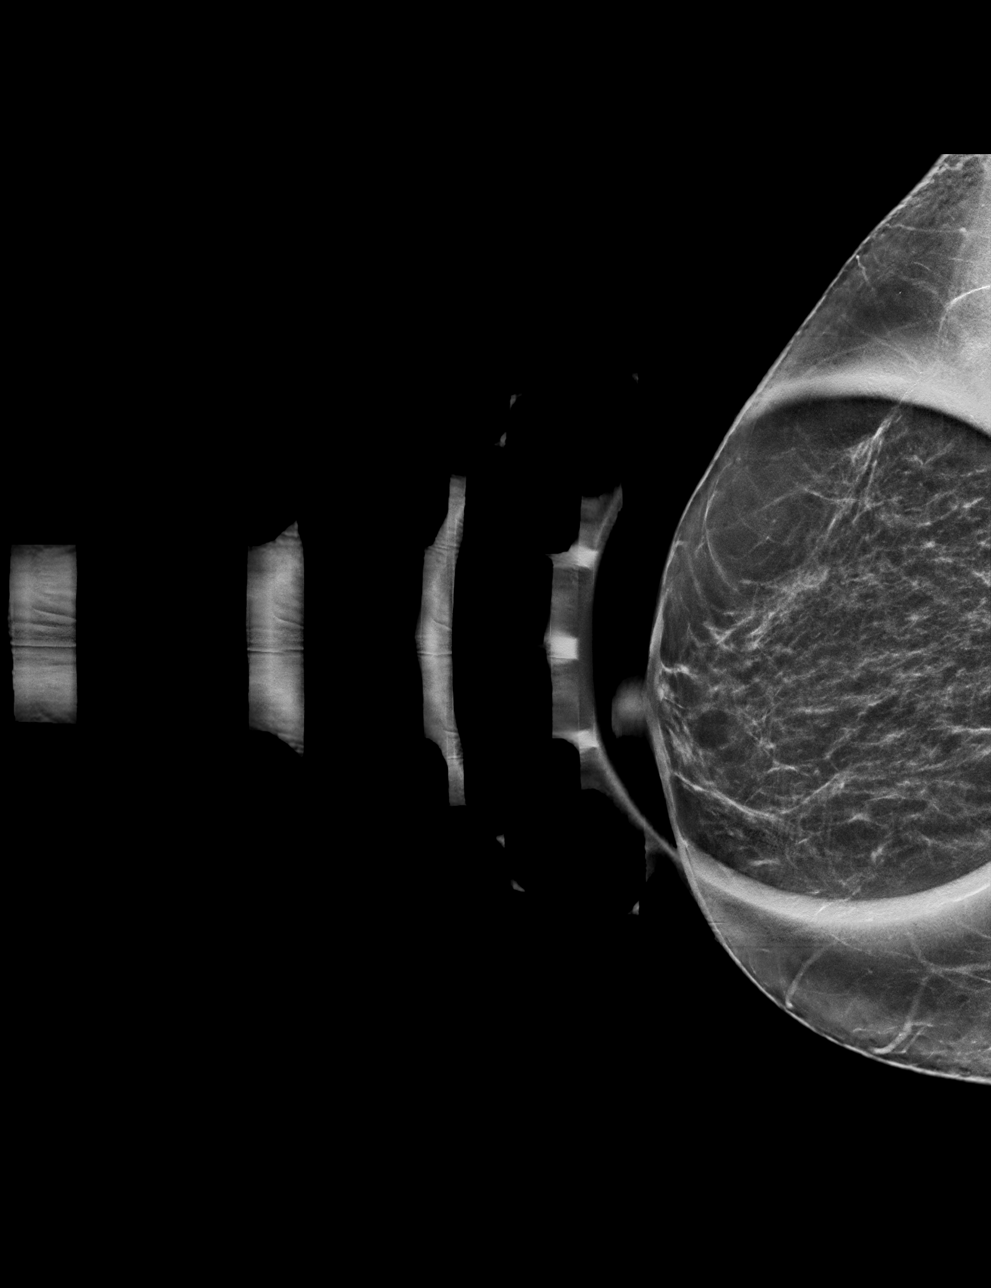

[R ML tomo · tomo slice 37/73.0]
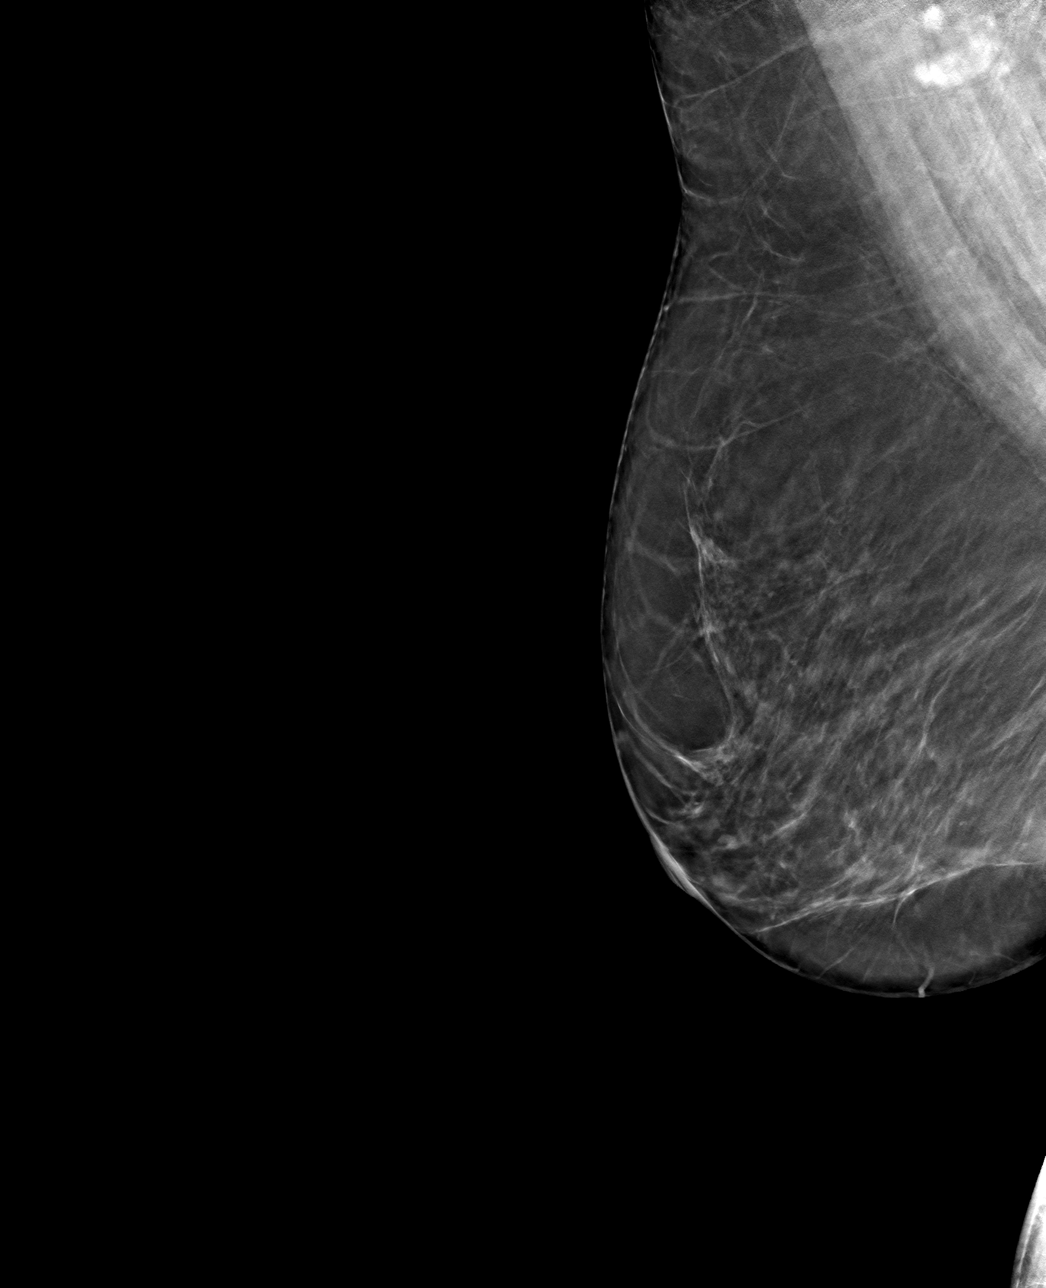

[R MLO tomo · tomo slice 29/57.0]
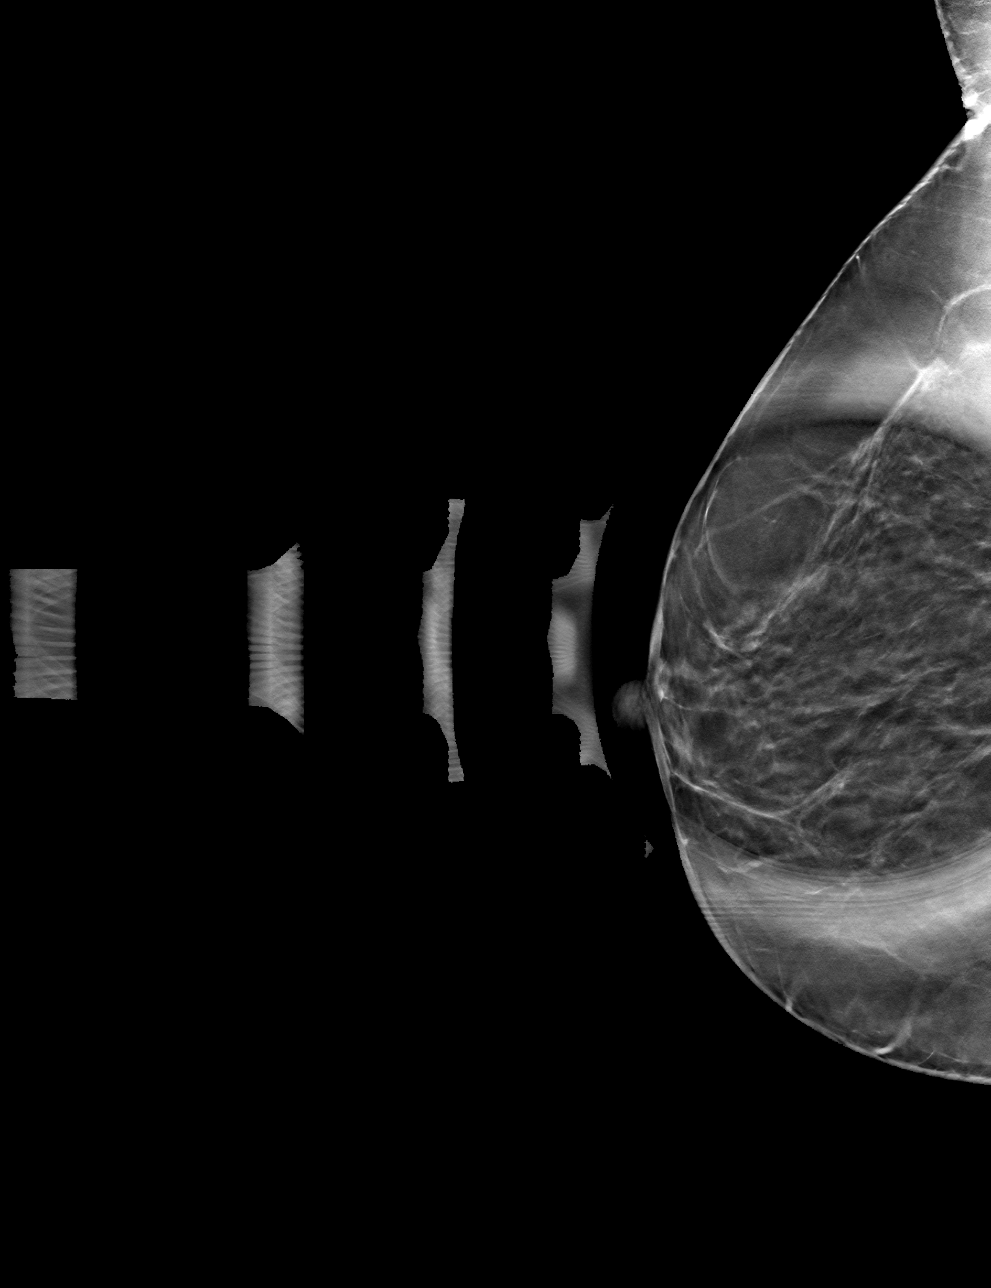

[4 of 12 positions shown; findings below may reference images not displayed]

ACR Breast Density Category b: There are scattered areas of
fibroglandular density.
FINDINGS: On today's additional diagnostic views, the questioned mass within
the retroareolar RIGHT breast is compatible with a
superimposition/confluence of normal caliber subareolar milk ducts.

Targeted ultrasound is performed, evaluating the retroareolar RIGHT
breast, showing only normal breast tissues. No solid or cystic mass.
No abnormal-appearing milk ducts.
IMPRESSION: No evidence of malignancy.

Patient may return to routine annual bilateral screening mammogram
schedule.

RECOMMENDATION:
Screening mammogram in one year.(Code:XJ-[id number])

I have discussed the findings and recommendations with the patient.
If applicable, a reminder letter will be sent to the patient
regarding the next appointment.

BI-RADS CATEGORY  1: Negative.

## 2022-07-21 ENCOUNTER — Ambulatory Visit: Payer: BC Managed Care – PPO | Attending: Internal Medicine | Admitting: Internal Medicine

## 2022-07-21 VITALS — BP 137/79 | HR 47 | Temp 97.8°F | Ht 64.0 in | Wt 169.0 lb

## 2022-07-21 DIAGNOSIS — Z2821 Immunization not carried out because of patient refusal: Secondary | ICD-10-CM

## 2022-07-21 DIAGNOSIS — E559 Vitamin D deficiency, unspecified: Secondary | ICD-10-CM

## 2022-07-21 DIAGNOSIS — E782 Mixed hyperlipidemia: Secondary | ICD-10-CM | POA: Diagnosis not present

## 2022-07-21 DIAGNOSIS — R030 Elevated blood-pressure reading, without diagnosis of hypertension: Secondary | ICD-10-CM | POA: Diagnosis not present

## 2022-07-21 NOTE — Progress Notes (Signed)
Patient ID: Kelly Arnold, female    DOB: [birth year]  MRN: [id number]  CC: Hypertension (HTN f/u. /Interested in bloodwork (cholesterol)./No to flu vax.)   Subjective: Kelly Arnold is a 66 y.o. female who presents for chronic ds management Her concerns today include:  Pt with hx of HTN, obesity, LT retinal vein occlusion 11/2020, COVID infection 09/2020, [place] trait  Feeling good over all; still walking at least 3 x wk Wants to recheck cholesterol level to see if she has reached targeted goal Uses healthy oils when she cooks and avoids certain salad dressing with her salads.    Checks BP 2 x a day levels have been below 130/80 Limits salt  Taking Vit D supplement 3x/wk.  Last Vit D level was 28  HM: declines flu shot. Declines RSV vaccine   Patient Active Problem List   Diagnosis Date Noted   Vitamin D deficiency 11/09/2021   Branch retinal vein occlusion with macular edema of left eye 12/31/2020   Obesity (BMI 30.0-34.9) 12/31/2020   Abnormal EKG 12/31/2020   Normocytic anemia 05/11/2012   Sinus tachycardia 05/11/2012     Current Outpatient Medications on File Prior to Visit  Medication Sig Dispense Refill   Ascorbic Acid (VITAMIN C PO) Take by mouth.     B Complex-C (B-COMPLEX WITH VITAMIN C) tablet Take 1 tablet by mouth daily.     Multiple Vitamin (MULTIVITAMIN WITH MINERALS) TABS Take 1 tablet by mouth daily.     VITAMIN D PO Take by mouth.     No current facility-administered medications on file prior to visit.    No Known Allergies  Social History   Socioeconomic History   Marital status: Married    Spouse name: Not on file   Number of children: Not on file   Years of education: Not on file   Highest education level: Not on file  Occupational History   Not on file  Tobacco Use   Smoking status: Never   Smokeless tobacco: Never  Vaping Use   Vaping Use: Never used  Substance and Sexual Activity   Alcohol use: No   Drug use: No   Sexual activity:  Yes  Other Topics Concern   Not on file  Social History Narrative   Not on file   Social Determinants of Health   Financial Resource Strain: Not on file  Food Insecurity: Not on file  Transportation Needs: Not on file  Physical Activity: Not on file  Stress: Not on file  Social Connections: Not on file  Intimate Partner Violence: Not on file    Family History  Problem Relation Age of Onset   Hypertension Neg Hx    Diabetes Neg Hx    CAD Neg Hx    CVA Neg Hx    Cancer Neg Hx    Colon cancer Neg Hx    Colon polyps Neg Hx    Esophageal cancer Neg Hx    Rectal cancer Neg Hx    Stomach cancer Neg Hx     Past Surgical History:  Procedure Laterality Date   CESAREAN SECTION     x2    ROS: Review of Systems Negative except as stated above  PHYSICAL EXAM: BP 137/79 (BP Location: Left Arm, Patient Position: Sitting, Cuff Size: Normal)   Pulse (!) 47   Temp 97.8 F (36.6 C) (Oral)   Ht 5\' 4"  (1.626 m)   Wt 169 lb (76.7 kg)   SpO2 100%   BMI 29.01  kg/m   Wt Readings from Last 3 Encounters:  07/21/22 169 lb (76.7 kg)  03/17/22 172 lb (78 kg)  11/09/21 168 lb 12.8 oz (76.6 kg)  BP 157/83  Physical Exam  General appearance - alert, well appearing, and in no distress Mental status - normal mood, behavior, speech, dress, motor activity, and thought processes Neck - supple, no significant adenopathy Chest - clear to auscultation, no wheezes, rales or rhonchi, symmetric air entry Heart - normal rate, regular rhythm, normal S1, S2, no murmurs, rubs, clicks or gallops Extremities - peripheral pulses normal, no pedal edema, no clubbing or cyanosis      Latest Ref Rng & Units 11/09/2021    4:41 PM 05/20/2021    4:10 PM 05/02/2021    4:02 PM  CMP  Glucose 70 - 99 mg/dL 85   89   BUN 8 - 27 mg/dL 11   13   Creatinine 4.94 - 1.00 mg/dL 4.96   7.59   Sodium 163 - 144 mmol/L 140   139   Potassium 3.5 - 5.2 mmol/L 4.4  4.6  4.2   Chloride 96 - 106 mmol/L 104   100   CO2 20  - 29 mmol/L 24   25   Calcium 8.7 - 10.3 mg/dL 9.3   9.8   Total Protein 6.0 - 8.5 g/dL 7.2     Total Bilirubin 0.0 - 1.2 mg/dL 0.2     Alkaline Phos 44 - 121 IU/L 72     AST 0 - 40 IU/L 19     ALT 0 - 32 IU/L 15      Lipid Panel     Component Value Date/Time   CHOL 228 (H) 03/17/2022 1645   TRIG 111 03/17/2022 1645   HDL 82 03/17/2022 1645   CHOLHDL 2.8 03/17/2022 1645   LDLCALC 127 (H) 03/17/2022 1645    CBC    Component Value Date/Time   WBC 4.8 11/09/2021 1641   WBC 8.0 05/12/2012 0615   RBC 4.95 11/09/2021 1641   RBC 2.99 (L) 05/12/2012 0615   HGB 12.4 11/09/2021 1641   HCT 37.3 11/09/2021 1641   PLT 231 11/09/2021 1641   MCV 75 (L) 11/09/2021 1641   MCH 25.1 (L) 11/09/2021 1641   MCH 24.4 (L) 05/12/2012 0615   MCHC 33.2 11/09/2021 1641   MCHC 31.2 05/12/2012 0615   RDW 14.0 11/09/2021 1641   LYMPHSABS 2.7 09/27/2018 1659   MONOABS 1.8 (H) 05/11/2012 0910   EOSABS 0.1 09/27/2018 1659   BASOSABS 0.0 09/27/2018 1659    ASSESSMENT AND PLAN:  1. Mixed hyperlipidemia We will recheck lipid profile. - Lipid panel  2. Vitamin D deficiency - Vitamin D, 25-hydroxy  3. Elevated blood pressure reading Blood pressure today above goal.  She was on HCTZ in the past but stopped it after blood pressure readings were good without it.  She reports blood pressure readings at home remain at goal.  I recommend that she brings her blood pressure monitoring device with her on the next visit so that we can check it with her device and with ours.  Continue healthy eating and regular exercise.  4. Influenza vaccination declined   Patient was given the opportunity to ask questions.  Patient verbalized understanding of the plan and was able to repeat key elements of the plan.   This documentation was completed using Paediatric nurse.  Any transcriptional errors are unintentional.  Orders Placed This Encounter  Procedures  Lipid panel   Vitamin D, 25-hydroxy      Requested Prescriptions    No prescriptions requested or ordered in this encounter    Return in about 4 months (around 11/19/2022).  Jonah Blue, MD, FACP

## 2022-07-22 ENCOUNTER — Encounter: Payer: Self-pay | Admitting: Internal Medicine

## 2022-07-22 LAB — LIPID PANEL
Chol/HDL Ratio: 2.9 ratio (ref 0.0–4.4)
Cholesterol, Total: 206 mg/dL — ABNORMAL HIGH (ref 100–199)
HDL: 72 mg/dL (ref >39)
LDL Chol Calc (NIH): 123 mg/dL — ABNORMAL HIGH (ref 0–99)
Triglycerides: 63 mg/dL (ref 0–149)
VLDL Cholesterol Cal: 11 mg/dL (ref 5–40)

## 2022-07-22 LAB — VITAMIN D 25 HYDROXY (VIT D DEFICIENCY, FRACTURES): Vit D, 25-Hydroxy: 31.1 ng/mL (ref 30.0–100.0)

## 2022-11-24 ENCOUNTER — Encounter: Payer: Self-pay | Admitting: Internal Medicine

## 2022-11-24 ENCOUNTER — Ambulatory Visit: Payer: BC Managed Care – PPO | Attending: Internal Medicine | Admitting: Internal Medicine

## 2022-11-24 VITALS — BP 147/73 | HR 50 | Temp 98.3°F | Ht 64.0 in | Wt 167.0 lb

## 2022-11-24 DIAGNOSIS — I10 Essential (primary) hypertension: Secondary | ICD-10-CM | POA: Diagnosis not present

## 2022-11-24 DIAGNOSIS — D573 Sickle-cell trait: Secondary | ICD-10-CM | POA: Insufficient documentation

## 2022-11-24 DIAGNOSIS — E782 Mixed hyperlipidemia: Secondary | ICD-10-CM

## 2022-11-24 NOTE — Progress Notes (Signed)
Patient ID: Kelly Arnold, female    DOB: [birth year]  MRN: [id number]  CC: Hyperlipidemia (Mixed hyperlipidemia. /No to flu vax. Unsure about scheduling another pap, talked to pt about previous results & advised a yrly pap.)   Subjective: Kelly Arnold is a 67 y.o. female who presents for Her concerns today include:  Pt with hx of HTN, obesity, LT retinal vein occlusion 11/2020, COVID infection 09/2020, [place] trait   Patient Active Problem List   Diagnosis Date Noted   Vitamin D deficiency 11/09/2021   Branch retinal vein occlusion with macular edema of left eye 12/31/2020   Obesity (BMI 30.0-34.9) 12/31/2020   Abnormal EKG 12/31/2020   Normocytic anemia 05/11/2012   Sinus tachycardia 05/11/2012     Current Outpatient Medications on File Prior to Visit  Medication Sig Dispense Refill   Ascorbic Acid (VITAMIN C PO) Take by mouth.     B Complex-C (B-COMPLEX WITH VITAMIN C) tablet Take 1 tablet by mouth daily.     Multiple Vitamin (MULTIVITAMIN WITH MINERALS) TABS Take 1 tablet by mouth daily.     VITAMIN D PO Take by mouth.     No current facility-administered medications on file prior to visit.    No Known Allergies  Social History   Socioeconomic History   Marital status: Married    Spouse name: Not on file   Number of children: Not on file   Years of education: Not on file   Highest education level: Not on file  Occupational History   Not on file  Tobacco Use   Smoking status: Never   Smokeless tobacco: Never  Vaping Use   Vaping Use: Never used  Substance and Sexual Activity   Alcohol use: No   Drug use: No   Sexual activity: Yes  Other Topics Concern   Not on file  Social History Narrative   Not on file   Social Determinants of Health   Financial Resource Strain: Not on file  Food Insecurity: Not on file  Transportation Needs: Not on file  Physical Activity: Not on file  Stress: Not on file  Social Connections: Not on file  Intimate Partner  Violence: Not on file    Family History  Problem Relation Age of Onset   Hypertension Neg Hx    Diabetes Neg Hx    CAD Neg Hx    CVA Neg Hx    Cancer Neg Hx    Colon cancer Neg Hx    Colon polyps Neg Hx    Esophageal cancer Neg Hx    Rectal cancer Neg Hx    Stomach cancer Neg Hx     Past Surgical History:  Procedure Laterality Date   CESAREAN SECTION     x2    ROS: Review of Systems Negative except as stated above  PHYSICAL EXAM: BP (!) 147/73 (BP Location: Left Arm, Patient Position: Sitting, Cuff Size: Normal)   Pulse (!) 50   Temp 98.3 F (36.8 C) (Oral)   Ht 5\' 4"  (1.626 m)   Wt 167 lb (75.8 kg)   SpO2 100%   BMI 28.67 kg/m   Physical Exam  {female adult master:310786} {female adult master:310785}     Latest Ref Rng & Units 11/09/2021    4:41 PM 05/20/2021    4:10 PM 05/02/2021    4:02 PM  CMP  Glucose 70 - 99 mg/dL 85   89   BUN 8 - 27 mg/dL 11   13   Creatinine  0.57 - 1.00 mg/dL 0.69   0.77   Sodium 134 - 144 mmol/L 140   139   Potassium 3.5 - 5.2 mmol/L 4.4  4.6  4.2   Chloride 96 - 106 mmol/L 104   100   CO2 20 - 29 mmol/L 24   25   Calcium 8.7 - 10.3 mg/dL 9.3   9.8   Total Protein 6.0 - 8.5 g/dL 7.2     Total Bilirubin 0.0 - 1.2 mg/dL 0.2     Alkaline Phos 44 - 121 IU/L 72     AST 0 - 40 IU/L 19     ALT 0 - 32 IU/L 15      Lipid Panel     Component Value Date/Time   CHOL 206 (H) 07/21/2022 1640   TRIG 63 07/21/2022 1640   HDL 72 07/21/2022 1640   CHOLHDL 2.9 07/21/2022 1640   LDLCALC 123 (H) 07/21/2022 1640    CBC    Component Value Date/Time   WBC 4.8 11/09/2021 1641   WBC 8.0 05/12/2012 0615   RBC 4.95 11/09/2021 1641   RBC 2.99 (L) 05/12/2012 0615   HGB 12.4 11/09/2021 1641   HCT 37.3 11/09/2021 1641   PLT 231 11/09/2021 1641   MCV 75 (L) 11/09/2021 1641   MCH 25.1 (L) 11/09/2021 1641   MCH 24.4 (L) 05/12/2012 0615   MCHC 33.2 11/09/2021 1641   MCHC 31.2 05/12/2012 0615   RDW 14.0 11/09/2021 1641   LYMPHSABS 2.7 09/27/2018  1659   MONOABS 1.8 (H) 05/11/2012 0910   EOSABS 0.1 09/27/2018 1659   BASOSABS 0.0 09/27/2018 1659    ASSESSMENT AND PLAN:  There are no diagnoses linked to this encounter.   Patient was given the opportunity to ask questions.  Patient verbalized understanding of the plan and was able to repeat key elements of the plan.   This documentation was completed using Radio producer.  Any transcriptional errors are unintentional.  No orders of the defined types were placed in this encounter.    Requested Prescriptions    No prescriptions requested or ordered in this encounter    No follow-ups on file.  Karle Plumber, MD, FACP

## 2022-11-25 ENCOUNTER — Encounter: Payer: Self-pay | Admitting: Internal Medicine

## 2023-03-30 ENCOUNTER — Ambulatory Visit: Payer: BC Managed Care – PPO | Attending: Internal Medicine | Admitting: Internal Medicine

## 2023-03-30 ENCOUNTER — Encounter: Payer: Self-pay | Admitting: Internal Medicine

## 2023-03-30 VITALS — BP 140/80 | HR 56 | Temp 98.2°F | Ht 64.0 in | Wt 173.0 lb

## 2023-03-30 DIAGNOSIS — H348192 Central retinal vein occlusion, unspecified eye, stable: Secondary | ICD-10-CM | POA: Diagnosis not present

## 2023-03-30 DIAGNOSIS — I10 Essential (primary) hypertension: Secondary | ICD-10-CM | POA: Diagnosis not present

## 2023-03-30 NOTE — Progress Notes (Signed)
Patient ID: Kelly Arnold, female    DOB: 1956/04/22  MRN: [id number]  CC: Follow-up (F/u. /No question / concerns/)   Subjective: Kelly Arnold is a 67 y.o. female who presents for chronic ds management Her concerns today include:  Pt with hx of HTN + white coat HTN, obesity, LT retinal vein occlusion 11/2020 (hypercoag work up negative), COVID infection 09/2020, [place] trait.  HTN: pt has component of WCH.  Checking BP regularly with wrist device.  Has log: 124/79, 116/72, 137/84, 120/69, 121/78, 133/77 Walks daily and doing well with eating habits.    Hx of LT Retinal Vein Occlusion: plans to schedule eye exam.  Last eye exam was 2 yrs ago. Some feeling of wetness LT eye for which she applies warm compresses. Does have dark spot in visual field since 11/2020 when she was dx   Patient Active Problem List   Diagnosis Date Noted   Sickle cell trait (HCC) 11/24/2022   Vitamin D deficiency 11/09/2021   Branch retinal vein occlusion with macular edema of left eye 12/31/2020   Obesity (BMI 30.0-34.9) 12/31/2020   Abnormal EKG 12/31/2020   Normocytic anemia 05/11/2012   Sinus tachycardia 05/11/2012     Current Outpatient Medications on File Prior to Visit  Medication Sig Dispense Refill   Ascorbic Acid (VITAMIN C PO) Take by mouth.     B Complex-C (B-COMPLEX WITH VITAMIN C) tablet Take 1 tablet by mouth daily.     Multiple Vitamin (MULTIVITAMIN WITH MINERALS) TABS Take 1 tablet by mouth daily.     VITAMIN D PO Take by mouth.     No current facility-administered medications on file prior to visit.    No Known Allergies  Social History   Socioeconomic History   Marital status: Married    Spouse name: Not on file   Number of children: Not on file   Years of education: Not on file   Highest education level: Not on file  Occupational History   Not on file  Tobacco Use   Smoking status: Never   Smokeless tobacco: Never  Vaping Use   Vaping status: Never Used  Substance and  Sexual Activity   Alcohol use: No   Drug use: No   Sexual activity: Yes  Other Topics Concern   Not on file  Social History Narrative   Not on file   Social Determinants of Health   Financial Resource Strain: Not on file  Food Insecurity: Not on file  Transportation Needs: Not on file  Physical Activity: Not on file  Stress: Not on file  Social Connections: Unknown (01/24/2022)   Received from [facility]   Social Network    Social Network: Not on file  Intimate Partner Violence: Unknown (12/16/2021)   Received from [facility]   HITS    Physically Hurt: Not on file    Insult or Talk Down To: Not on file    Threaten Physical Harm: Not on file    Scream or Curse: Not on file    Family History  Problem Relation Age of Onset   Hypertension Neg Hx    Diabetes Neg Hx    CAD Neg Hx    CVA Neg Hx    Cancer Neg Hx    Colon cancer Neg Hx    Colon polyps Neg Hx    Esophageal cancer Neg Hx    Rectal cancer Neg Hx    Stomach cancer Neg Hx  Past Surgical History:  Procedure Laterality Date   CESAREAN SECTION     x2    ROS: Review of Systems Negative except as stated above  PHYSICAL EXAM: BP (!) 140/80   Pulse (!) 56   Temp 98.2 F (36.8 C) (Oral)   Ht 5\' 4"  (1.626 m)   Wt 173 lb (78.5 kg)   SpO2 99%   BMI 29.70 kg/m   Wt Readings from Last 3 Encounters:  03/30/23 173 lb (78.5 kg)  11/24/22 167 lb (75.8 kg)  07/21/22 169 lb (76.7 kg)    Physical Exam  General appearance - alert, well appearing, and in no distress Chest - clear to auscultation, no wheezes, rales or rhonchi, symmetric air entry Heart - normal rate, regular rhythm, normal S1, S2, no murmurs, rubs, clicks or gallops Extremities - peripheral pulses normal, no pedal edema, no clubbing or cyanosis      Latest Ref Rng & Units 11/09/2021    4:41 PM 05/20/2021    4:10 PM 05/02/2021    4:02 PM  CMP  Glucose 70 - 99 mg/dL 85   89   BUN 8 - 27 mg/dL 11   13    Creatinine 2.13 - 1.00 mg/dL 0.86   5.78   Sodium 469 - 144 mmol/L 140   139   Potassium 3.5 - 5.2 mmol/L 4.4  4.6  4.2   Chloride 96 - 106 mmol/L 104   100   CO2 20 - 29 mmol/L 24   25   Calcium 8.7 - 10.3 mg/dL 9.3   9.8   Total Protein 6.0 - 8.5 g/dL 7.2     Total Bilirubin 0.0 - 1.2 mg/dL 0.2     Alkaline Phos 44 - 121 IU/L 72     AST 0 - 40 IU/L 19     ALT 0 - 32 IU/L 15      Lipid Panel     Component Value Date/Time   CHOL 206 (H) 07/21/2022 1640   TRIG 63 07/21/2022 1640   HDL 72 07/21/2022 1640   CHOLHDL 2.9 07/21/2022 1640   LDLCALC 123 (H) 07/21/2022 1640    CBC    Component Value Date/Time   WBC 4.8 11/09/2021 1641   WBC 8.0 05/12/2012 0615   RBC 4.95 11/09/2021 1641   RBC 2.99 (L) 05/12/2012 0615   HGB 12.4 11/09/2021 1641   HCT 37.3 11/09/2021 1641   PLT 231 11/09/2021 1641   MCV 75 (L) 11/09/2021 1641   MCH 25.1 (L) 11/09/2021 1641   MCH 24.4 (L) 05/12/2012 0615   MCHC 33.2 11/09/2021 1641   MCHC 31.2 05/12/2012 0615   RDW 14.0 11/09/2021 1641   LYMPHSABS 2.7 09/27/2018 1659   MONOABS 1.8 (H) 05/11/2012 0910   EOSABS 0.1 09/27/2018 1659   BASOSABS 0.0 09/27/2018 1659    ASSESSMENT AND PLAN: 1. White coat syndrome with diagnosis of hypertension 2. Essential hypertension -Home BP readings good but I recommend checking BP at home with automated arm device.  Let me know if BP running >130/80 Continue DASH and regular exercise  3. History of retinal vein occlusion -advise scheduling f/u visit for eye exam   Patient was given the opportunity to ask questions.  Patient verbalized understanding of the plan and was able to repeat key elements of the plan.   This documentation was completed using Paediatric nurse.  Any transcriptional errors are unintentional.  No orders of the defined types were placed in this encounter.  Requested Prescriptions    No prescriptions requested or ordered in this encounter    Return in about  4 months (around 07/31/2023).  Jonah Blue, MD, FACP

## 2023-06-05 ENCOUNTER — Other Ambulatory Visit: Payer: Self-pay

## 2023-07-03 ENCOUNTER — Ambulatory Visit
Admission: EM | Admit: 2023-07-03 | Discharge: 2023-07-03 | Disposition: A | Discharge status: Home / Self Care | Payer: BC Managed Care – PPO | Attending: Physician Assistant | Admitting: Physician Assistant

## 2023-07-03 DIAGNOSIS — J069 Acute upper respiratory infection, unspecified: Secondary | ICD-10-CM | POA: Insufficient documentation

## 2023-07-03 DIAGNOSIS — U071 COVID-19: Secondary | ICD-10-CM | POA: Insufficient documentation

## 2023-07-03 LAB — POCT INFLUENZA A/B
Influenza A, POC: NEGATIVE
Influenza B, POC: NEGATIVE

## 2023-07-03 NOTE — ED Triage Notes (Addendum)
Patient presents with sore throat, coughing, headache and temp of 99.0 x day 3.  Patient states she traveled to [place] over the weekend.

## 2023-07-03 NOTE — ED Provider Notes (Signed)
[facility]    CSN: [id number] Arrival date & time: 07/03/23  1609      History   Chief Complaint No chief complaint on file.   HPI Kelly Arnold is a 67 y.o. female.   Patient here today for evaluation of sore throat, coughing, low-grade temp that started 3 days ago.  She states she did travel out of state over the weekend.  She denies any vomiting or diarrhea.  She has taken over-the-counter medication without resolution.  The history is provided by the patient.    Past Medical History:  Diagnosis Date   History of transfusion 2013   Normocytic anemia     Patient Active Problem List   Diagnosis Date Noted   Sickle cell trait (HCC) 11/24/2022   Vitamin D deficiency 11/09/2021   Branch retinal vein occlusion with macular edema of left eye 12/31/2020   Obesity (BMI 30.0-34.9) 12/31/2020   Abnormal EKG 12/31/2020   Normocytic anemia 05/11/2012   Sinus tachycardia 05/11/2012    Past Surgical History:  Procedure Laterality Date   CESAREAN SECTION     x2    OB History   No obstetric history on file.      Home Medications    Prior to Admission medications   Medication Sig Start Date End Date Taking? Authorizing Provider  Ascorbic Acid (VITAMIN C PO) Take by mouth.    [provider]  B Complex-C (B-COMPLEX WITH VITAMIN C) tablet Take 1 tablet by mouth daily.    [provider]  Multiple Vitamin (MULTIVITAMIN WITH MINERALS) TABS Take 1 tablet by mouth daily.    [provider]  VITAMIN D PO Take by mouth.    [provider]    Family History Family History  Problem Relation Age of Onset   Hypertension Neg Hx    Diabetes Neg Hx    CAD Neg Hx    CVA Neg Hx    Cancer Neg Hx    Colon cancer Neg Hx    Colon polyps Neg Hx    Esophageal cancer Neg Hx    Rectal cancer Neg Hx    Stomach cancer Neg Hx     Social History Social History   Tobacco Use   Smoking status: Never   Smokeless tobacco: Never   Vaping Use   Vaping status: Never Used  Substance Use Topics   Alcohol use: No   Drug use: No     Allergies   Patient has no known allergies.   Review of Systems Review of Systems  Constitutional:  Positive for fever. Negative for chills.  HENT:  Positive for congestion and sore throat. Negative for ear pain.   Eyes:  Negative for discharge and redness.  Respiratory:  Positive for cough. Negative for shortness of breath and wheezing.   Gastrointestinal:  Negative for abdominal pain, diarrhea, nausea and vomiting.     Physical Exam Triage Vital Signs ED Triage Vitals  Encounter Vitals Group     BP 07/03/23 1822 (!) 144/81     Systolic BP Percentile --      Diastolic BP Percentile --      Pulse Rate 07/03/23 1822 68     Resp 07/03/23 1822 16     Temp 07/03/23 1822 99.2 F (37.3 C)     Temp Source 07/03/23 1822 Oral     SpO2 07/03/23 1822 100 %     Weight 07/03/23 1821 165 lb (74.8 kg)     Height 07/03/23  1821 5\' 4"  (1.626 m)     Head Circumference --      Peak Flow --      Pain Score 07/03/23 1820 8     Pain Loc --      Pain Education --      Exclude from Growth Chart --    No data found.  Updated Vital Signs BP (!) 144/81 (BP Location: Left Arm)   Pulse 68   Temp 99.2 F (37.3 C) (Oral)   Resp 16   Ht 5\' 4"  (1.626 m)   Wt 165 lb (74.8 kg)   SpO2 100%   BMI 28.32 kg/m      Physical Exam Vitals and nursing note reviewed.  Constitutional:      General: She is not in acute distress.    Appearance: Normal appearance. She is not ill-appearing.  HENT:     Head: Normocephalic and atraumatic.     Nose: Congestion present.     Mouth/Throat:     Mouth: Mucous membranes are moist.     Pharynx: No oropharyngeal exudate or posterior oropharyngeal erythema.  Eyes:     Conjunctiva/sclera: Conjunctivae normal.  Cardiovascular:     Rate and Rhythm: Normal rate and regular rhythm.     Heart sounds: Normal heart sounds. No murmur heard. Pulmonary:     Effort:  Pulmonary effort is normal. No respiratory distress.     Breath sounds: Normal breath sounds. No wheezing, rhonchi or rales.  Skin:    General: Skin is warm and dry.  Neurological:     Mental Status: She is alert.  Psychiatric:        Mood and Affect: Mood normal.        Thought Content: Thought content normal.      UC Treatments / Results  Labs (all labs ordered are listed, but only abnormal results are displayed) Labs Reviewed  SARS CORONAVIRUS 2 (TAT 6-24 HRS) - Abnormal; Notable for the following components:      Result Value   SARS Coronavirus 2 POSITIVE (*)    All other components within normal limits  POCT INFLUENZA A/B - Normal    EKG   Radiology No results found.  Procedures Procedures (including critical care time)  Medications Ordered in UC Medications - No data to display  Initial Impression / Assessment and Plan / UC Course  I have reviewed the triage vital signs and the nursing notes.  Pertinent labs & imaging results that were available during my care of the patient were reviewed by me and considered in my medical decision making (see chart for details).    Suspect viral etiology of symptoms.  Will screen for COVID and recommended follow-up if no gradual improvement with any further concerns.  Encouraged symptomatic treatment while awaiting results.  Final Clinical Impressions(s) / UC Diagnoses   Final diagnoses:  Viral upper respiratory tract infection   Discharge Instructions   None    ED Prescriptions   None    PDMP not reviewed this encounter.   Tomi Bamberger, PA-C 07/18/23 [phone]

## 2023-07-04 LAB — SARS CORONAVIRUS 2 (TAT 6-24 HRS): SARS Coronavirus 2: POSITIVE — AB

## 2023-07-06 ENCOUNTER — Ambulatory Visit: Payer: Self-pay

## 2023-07-06 NOTE — Telephone Encounter (Signed)
Spoke with patient. Advised that she is out of the window for anti vitals as she has had her s/s for more than 5 day. Advised to drink plenty of fluids and continue to take tylenol as needed. Patient voiced that she feels much better,

## 2023-07-06 NOTE — Telephone Encounter (Signed)
Chief Complaint: Covid + Symptoms: mild cough, body ache, congestion  Frequency: comes and goes  Pertinent Negatives: Patient denies fever, nausea, vomiting, headache, loss of taste and smell Disposition: [] ED /[] Urgent Care (no appt availability in office) / [] Appointment(In office/virtual)/ []  [facility]/ [x] Home Care/ [] Refused Recommended Disposition /[] [facility]/ []  Follow-up with PCP Additional Notes: Patient stated she tested positive for Covid on Tuesday at Urgent Care. Patient stated she was not prescribed anything during her visit. Patient states her symptoms have improved since her urgent care visit and wanted to know if there was anything she should be taking to the Covid. Care advice was given and patient verbalized understanding. Advised patient to callback if symptoms get worse.    Summary: positive covid   Pt tested positive for covid Tuesday at [facility] and would like a nurse to call her back  [phone]     Reason for Disposition  [1] COVID-19 diagnosed by doctor (or NP/PA) AND [2] mild symptoms (e.g., cough, fever, others) AND [3] no complications or SOB  Answer Assessment - Initial Assessment Questions 1. COVID-19 DIAGNOSIS: "How do you know that you have COVID?" (e.g., positive lab test or self-test, diagnosed by doctor or NP/PA, symptoms after exposure).     Tested positive at Urgent Care Tuesday   2. COVID-19 EXPOSURE: "Was there any known exposure to COVID before the symptoms began?" CDC Definition of close contact: within 6 feet (2 meters) for a total of 15 minutes or more over a 24-hour period.      No  3. ONSET: "When did the COVID-19 symptoms start?"      Tuesday  4. WORST SYMPTOM: "What is your worst symptom?" (e.g., cough, fever, shortness of breath, muscle aches)     Body ache 5. COUGH: "Do you have a cough?" If Yes, ask: "How bad is the cough?"       Yes, mild  6. FEVER: "Do you have a fever?" If Yes, ask: "What is your temperature,  how was it measured, and when did it start?"     No, 98.0 7. RESPIRATORY STATUS: "Describe your breathing?" (e.g., normal; shortness of breath, wheezing, unable to speak)      Normal  8. BETTER-SAME-WORSE: "Are you getting better, staying the same or getting worse compared to yesterday?"  If getting worse, ask, "In what way?"     Better  9. OTHER SYMPTOMS: "Do you have any other symptoms?"  (e.g., chills, fatigue, headache, loss of smell or taste, muscle pain, sore throat)     Congestion, body ache, sweating, sore throat  10. HIGH RISK DISEASE: "Do you have any chronic medical problems?" (e.g., asthma, heart or lung disease, weak immune system, obesity, etc.)       Not that I know of  Protocols used: Coronavirus (COVID-19) Diagnosed or Suspected-A-AH

## 2023-07-18 ENCOUNTER — Encounter: Payer: Self-pay | Admitting: Physician Assistant

## 2023-08-03 ENCOUNTER — Encounter: Payer: Self-pay | Admitting: Internal Medicine

## 2023-08-03 ENCOUNTER — Ambulatory Visit: Payer: BC Managed Care – PPO | Attending: Internal Medicine | Admitting: Internal Medicine

## 2023-08-03 VITALS — BP 134/78 | HR 54 | Temp 98.1°F | Ht 64.0 in | Wt 170.0 lb

## 2023-08-03 DIAGNOSIS — E782 Mixed hyperlipidemia: Secondary | ICD-10-CM | POA: Diagnosis not present

## 2023-08-03 DIAGNOSIS — E559 Vitamin D deficiency, unspecified: Secondary | ICD-10-CM | POA: Diagnosis not present

## 2023-08-03 DIAGNOSIS — Z2821 Immunization not carried out because of patient refusal: Secondary | ICD-10-CM | POA: Diagnosis not present

## 2023-08-03 DIAGNOSIS — I10 Essential (primary) hypertension: Secondary | ICD-10-CM

## 2023-08-03 DIAGNOSIS — R0982 Postnasal drip: Secondary | ICD-10-CM

## 2023-08-03 NOTE — Patient Instructions (Signed)
Let me know of episodes of feeling heart beat in the head starts occurring more often as we can refer to specialist.    Continue to monitor blood pressure.  Exercise indoors on days when weather prohibits getting out to walk.   Will get with you about dose for Vitamin D once I get lab results.

## 2023-08-03 NOTE — Progress Notes (Signed)
Patient ID: Kelly Arnold, female    DOB: Feb 04, 1956  MRN: [id number]  CC: Follow-up (Follow-up. /Discuss Vit D - recenty bought 500 IU of Vit D/No to flu & shingles, pneumonia vax. Pap appt schedule with gyno)   Subjective: Kelly Arnold is a 67 y.o. female who presents for chronic ds management. Her concerns today include:  Pt with hx of HTN + white coat HTN, obesity, LT retinal vein occlusion 11/2020 (hypercoag work up negative), COVID infection 09/2020, [place] trait.   Seen at [facility] 10/22 with UR symptoms.  +COVID. Since COVID, she occasionally she gets itching in throat that makes her cough; endorses drainage at back of throat Vit D def:  Was taking Vitamin D 2000 international units  about 3x/wk; since COVID dx she increased to 5000 international units daily.  Wants to know if this dose is okay.  Still checks BP regularly.  She has essential hypertension with whitecoat component.  We have been observing her off medication.  Has log with her for my review.  Blood pressure was running high for a few days when she was diagnosed with COVID.  Since then levels have come back down.  Some of her most recent readings are 129/80, 117/71, 143/90, 120/75, 126/81, 129/83. Less walking since temp change.  Has stationary bike at home which she can use. Sometimes she feels her heart beat at the back of her head; not headache. Not palpitations or feeling of heart bumping strong.  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     Multiple Vitamin (MULTIVITAMIN WITH MINERALS) TABS Take 1 tablet by mouth daily.     VITAMIN D PO Take by mouth.     Ascorbic Acid (VITAMIN C PO) Take by mouth. (Patient not taking: Reported on 08/03/2023)     No current facility-administered medications on file prior to visit.    No Known Allergies  Social History   Socioeconomic History   Marital status: Married    Spouse name: Not on file   Number of children: Not on file   Years of education: Not on file   Highest education level: Not on file  Occupational History   Not on file  Tobacco Use   Smoking status: Never   Smokeless tobacco: Never  Vaping Use   Vaping status: Never Used  Substance and Sexual Activity   Alcohol use: No   Drug use: No   Sexual activity: Yes  Other Topics Concern   Not on file  Social History Narrative   Not on file   Social Determinants of Health   Financial Resource Strain: Not on file  Food Insecurity: Not on file  Transportation Needs: Not on file  Physical Activity: Not on file  Stress: Not on file  Social Connections: Unknown (01/24/2022)   [facility]   Social Network    Social Network: Not on file  Intimate Partner Violence: Unknown (  12/16/2021)   [facility]   HITS    Physically Hurt: Not on file    Insult or Talk Down To: Not on file    Threaten Physical Harm: Not on file    Scream or Curse: Not on file    Family History  Problem Relation Age of Onset   Hypertension Neg Hx    Diabetes Neg Hx    CAD Neg Hx    CVA Neg Hx    Cancer Neg Hx    Colon cancer Neg Hx    Colon polyps Neg Hx    Esophageal cancer Neg Hx    Rectal cancer Neg Hx    Stomach cancer Neg Hx     Past Surgical History:  Procedure Laterality Date   CESAREAN SECTION     x2    ROS: Review of Systems Negative except as stated above  PHYSICAL EXAM: BP 134/78 (BP  Location: Left Arm, Patient Position: Sitting, Cuff Size: Normal)   Pulse (!) 54   Temp 98.1 F (36.7 C) (Oral)   Ht 5\' 4"  (1.626 m)   Wt 170 lb (77.1 kg)   SpO2 99%   BMI 29.18 kg/m   Wt Readings from Last 3 Encounters:  08/03/23 170 lb (77.1 kg)  07/03/23 165 lb (74.8 kg)  03/30/23 173 lb (78.5 kg)    Physical Exam  General appearance - alert, well appearing, older female and in no distress Mental status - normal mood, behavior, speech, dress, motor activity, and thought processes Neck - no cervical LN, no carotid bruits Chest - clear to auscultation, no wheezes, rales or rhonchi, symmetric air entry Heart - normal rate, regular rhythm, normal S1, S2, no murmurs, rubs, clicks or gallops      Latest Ref Rng & Units 11/09/2021    4:41 PM 05/20/2021    4:10 PM 05/02/2021    4:02 PM  CMP  Glucose 70 - 99 mg/dL 85   89   BUN 8 - 27 mg/dL 11   13   Creatinine 1.61 - 1.00 mg/dL 0.96   0.45   Sodium 409 - 144 mmol/L 140   139   Potassium 3.5 - 5.2 mmol/L 4.4  4.6  4.2   Chloride 96 - 106 mmol/L 104   100   CO2 20 - 29 mmol/L 24   25   Calcium 8.7 - 10.3 mg/dL 9.3   9.8   Total Protein 6.0 - 8.5 g/dL 7.2     Total Bilirubin 0.0 - 1.2 mg/dL 0.2     Alkaline Phos 44 - 121 IU/L 72     AST 0 - 40 IU/L 19     ALT 0 - 32 IU/L 15      Lipid Panel     Component Value Date/Time   CHOL 206 (H) 07/21/2022 1640   TRIG 63 07/21/2022 1640   HDL 72 07/21/2022 1640   CHOLHDL 2.9 07/21/2022 1640   LDLCALC 123 (H) 07/21/2022 1640    CBC    Component Value Date/Time   WBC 4.8 11/09/2021 1641   WBC 8.0 05/12/2012 0615   RBC 4.95 11/09/2021 1641   RBC 2.99 (L) 05/12/2012 0615   HGB 12.4 11/09/2021 1641   HCT 37.3 11/09/2021 1641   PLT 231 11/09/2021 1641   MCV 75 (L) 11/09/2021 1641   MCH 25.1 (L) 11/09/2021 1641   MCH 24.4 (L) 05/12/2012 0615   MCHC 33.2 11/09/2021 1641   MCHC 31.2  05/12/2012 0615   RDW 14.0 11/09/2021 1641   LYMPHSABS 2.7 09/27/2018 1659   MONOABS 1.8 (H)  05/11/2012 0910   EOSABS 0.1 09/27/2018 1659   BASOSABS 0.0 09/27/2018 1659    ASSESSMENT AND PLAN: 1. White coat syndrome with diagnosis of hypertension Most of her home blood pressure readings are good.  We will continue to observe off medication.  Encouraged her to continue regular exercise.  When weather does not permit, she can use her stationary bike at home for exercise. In regards to the heart beat sensation that she gets in her head, I recommend that we observe for now.  If this starts happening more often, we can refer to neurology or cardiology for further evaluation. - CBC - Comprehensive metabolic panel  2. Vitamin D deficiency We will check vitamin D level and I will get back to her with the results and recommendation for dosing.  However I did tell her that in postmenopausal women the recommendation is for calcium 600 mg twice a day and vitamin D 800 IU daily. - VITAMIN D 25 Hydroxy (Vit-D Deficiency, Fractures)  3. Influenza vaccination declined   4. Pneumococcal vaccination declined   5. Herpes zoster vaccination declined   6. Mixed hyperlipidemia Due for recheck on lipid profile. - Lipid panel  7.  Postnasal drip She is experiencing some residual postnasal drip after having COVID infection.  Advised use of Claritin over-the-counter as needed.  Patient was given the opportunity to ask questions.  Patient verbalized understanding of the plan and was able to repeat key elements of the plan.   This documentation was completed using Paediatric nurse.  Any transcriptional errors are unintentional.  Orders Placed This Encounter  Procedures   CBC   Comprehensive metabolic panel   Lipid panel   VITAMIN D 25 Hydroxy (Vit-D Deficiency, Fractures)     Requested Prescriptions    No prescriptions requested or ordered in this encounter    Return in about 4 months (around 12/01/2023).  Jonah Blue, MD, FACP

## 2023-08-04 LAB — COMPREHENSIVE METABOLIC PANEL
ALT: 19 [IU]/L (ref 0–32)
AST: 20 [IU]/L (ref 0–40)
Albumin: 4.2 g/dL (ref 3.9–4.9)
Alkaline Phosphatase: 74 [IU]/L (ref 44–121)
BUN/Creatinine Ratio: 21 (ref 12–28)
BUN: 16 mg/dL (ref 8–27)
Bilirubin Total: <0.2 mg/dL (ref 0.0–1.2)
CO2: 25 mmol/L (ref 20–29)
Calcium: 9.3 mg/dL (ref 8.7–10.3)
Chloride: 104 mmol/L (ref 96–106)
Creatinine, Ser: 0.76 mg/dL (ref 0.57–1.00)
Globulin, Total: 2.8 g/dL (ref 1.5–4.5)
Glucose: 84 mg/dL (ref 70–99)
Potassium: 5 mmol/L (ref 3.5–5.2)
Sodium: 141 mmol/L (ref 134–144)
Total Protein: 7 g/dL (ref 6.0–8.5)
eGFR: 87 mL/min/{1.73_m2} (ref >59)

## 2023-08-04 LAB — LIPID PANEL
Chol/HDL Ratio: 2.7 ratio (ref 0.0–4.4)
Cholesterol, Total: 208 mg/dL — ABNORMAL HIGH (ref 100–199)
HDL: 76 mg/dL (ref >39)
LDL Chol Calc (NIH): 120 mg/dL — ABNORMAL HIGH (ref 0–99)
Triglycerides: 67 mg/dL (ref 0–149)
VLDL Cholesterol Cal: 12 mg/dL (ref 5–40)

## 2023-08-04 LAB — VITAMIN D 25 HYDROXY (VIT D DEFICIENCY, FRACTURES): Vit D, 25-Hydroxy: 40.4 ng/mL (ref 30.0–100.0)

## 2023-08-04 LAB — CBC
Hematocrit: 38.6 % (ref 34.0–46.6)
Hemoglobin: 12.5 g/dL (ref 11.1–15.9)
MCH: 25.1 pg — ABNORMAL LOW (ref 26.6–33.0)
MCHC: 32.4 g/dL (ref 31.5–35.7)
MCV: 78 fL — ABNORMAL LOW (ref 79–97)
Platelets: 222 10*3/uL (ref 150–450)
RBC: 4.98 x10E6/uL (ref 3.77–5.28)
RDW: 14.4 % (ref 11.7–15.4)
WBC: 5.2 10*3/uL (ref 3.4–10.8)

## 2023-08-05 ENCOUNTER — Encounter: Payer: Self-pay | Admitting: Internal Medicine

## 2023-08-06 ENCOUNTER — Other Ambulatory Visit: Payer: Self-pay | Admitting: Internal Medicine

## 2023-08-06 DIAGNOSIS — E782 Mixed hyperlipidemia: Secondary | ICD-10-CM

## 2023-08-06 MED ORDER — ATORVASTATIN CALCIUM 10 MG PO TABS: 10.0000 mg | ORAL_TABLET | Freq: Every day | ORAL | 3 refills | Status: DC

## 2023-09-10 ENCOUNTER — Ambulatory Visit: Payer: BC Managed Care – PPO | Attending: Internal Medicine

## 2023-09-10 DIAGNOSIS — E782 Mixed hyperlipidemia: Secondary | ICD-10-CM

## 2023-09-11 LAB — HEPATIC FUNCTION PANEL
ALT: 30 [IU]/L (ref 0–32)
AST: 30 [IU]/L (ref 0–40)
Albumin: 4.2 g/dL (ref 3.9–4.9)
Alkaline Phosphatase: 77 [IU]/L (ref 44–121)
Bilirubin Total: 0.3 mg/dL (ref 0.0–1.2)
Bilirubin, Direct: 0.1 mg/dL (ref 0.00–0.40)
Total Protein: 6.6 g/dL (ref 6.0–8.5)

## 2023-10-15 ENCOUNTER — Other Ambulatory Visit (HOSPITAL_COMMUNITY)
Admission: RE | Admit: 2023-10-15 | Discharge: 2023-10-15 | Disposition: A | Discharge status: Home / Self Care | Payer: 59 | Source: Ambulatory Visit | Attending: Obstetrics and Gynecology | Admitting: Obstetrics and Gynecology

## 2023-10-15 ENCOUNTER — Other Ambulatory Visit: Payer: Self-pay | Admitting: Obstetrics and Gynecology

## 2023-10-15 DIAGNOSIS — Z01419 Encounter for gynecological examination (general) (routine) without abnormal findings: Secondary | ICD-10-CM | POA: Insufficient documentation

## 2023-10-17 ENCOUNTER — Telehealth: Payer: Self-pay

## 2023-10-17 NOTE — Telephone Encounter (Signed)
 Spoke patient advised that if there no issues, patient is not on any medications that affect liver any conditions involving the liver or previous abnormal labs then Hepatic Function Panel are yearly. Advised that at next OV she may discuss in further details with provider.

## 2023-10-17 NOTE — Telephone Encounter (Signed)
 Copied from CRM [id number]. Topic: General - Other >> Oct 17, 2023 10:59 AM Kelly Arnold wrote: How often should I have a Hepatic Function Panel.  Please advise

## 2023-10-18 LAB — CYTOLOGY - PAP
Comment: NEGATIVE
Diagnosis: NEGATIVE
High risk HPV: NEGATIVE

## 2023-12-07 ENCOUNTER — Ambulatory Visit: Payer: BC Managed Care – PPO | Attending: Internal Medicine | Admitting: Internal Medicine

## 2023-12-07 ENCOUNTER — Encounter: Payer: Self-pay | Admitting: Internal Medicine

## 2023-12-07 VITALS — BP 148/75 | HR 55 | Temp 97.9°F | Ht 64.0 in | Wt 174.0 lb

## 2023-12-07 DIAGNOSIS — I10 Essential (primary) hypertension: Secondary | ICD-10-CM

## 2023-12-07 DIAGNOSIS — E782 Mixed hyperlipidemia: Secondary | ICD-10-CM

## 2023-12-07 DIAGNOSIS — E559 Vitamin D deficiency, unspecified: Secondary | ICD-10-CM | POA: Diagnosis not present

## 2023-12-07 DIAGNOSIS — H34832 Tributary (branch) retinal vein occlusion, left eye, with macular edema: Secondary | ICD-10-CM | POA: Diagnosis not present

## 2023-12-07 MED ORDER — ATORVASTATIN CALCIUM 10 MG PO TABS
10.0000 mg | ORAL_TABLET | Freq: Every day | ORAL | 1 refills | Status: DC
Start: 2023-12-07 — End: 2024-06-02

## 2023-12-07 NOTE — Progress Notes (Signed)
 Patient ID: Kelly Arnold, female    DOB: 1956-08-19  MRN: [id number]  CC: Follow-up (Follow-up. Med refill. /Requesting blood work to check Vit D & kidney / liver function/Discuss eyes & treatment - concern that treatment may interfere with work/No to flu vax)   Subjective: Kelly Arnold is a 68 y.o. female who presents for chronic ds management. Her concerns today include:  Pt with hx of HTN + white coat HTN, obesity, LT retinal vein occlusion 11/2020 (hypercoag work up negative), COVID infection 09/2020, [place] trait.   Discussed the use of AI scribe software for clinical note transcription with the patient, who gave verbal consent to proceed.  History of Present Illness   Kelly Arnold is a 68 year old female with hyperlipidemia and hypertension who presents for follow-up and management of her conditions.  She has been taking atorvastatin 10 mg since November when her LDL cholesterol was 120 mg/dL, with a target of reducing it to below 100 mg/dL. She tolerates the medication well, taking it at night to avoid interactions with other morning medications.  She would recheck of lipid profile and liver function tests.   She has branch retinal vein occlusion with macular edema in her left eye, leading to worsening vision, especially at night. This condition affects her ability to drive and causes a misty appearance on her glasses. She feels a 'wetness' in the eye and is concerned about the progression.  She has been seeing a retina specialist Dr. Luciana Axe who recommended some injection treatments but patient is apprehensive since there is no guarantee of how much the vision would improve or not.  We continue to observe blood pressure off medication.  Her blood pressure readings at home are mostly around 130/80 mmHg or lower.  Some of her most recent readings are 136/77, 130/78, 131/82, 128/78.  She continues to monitor her blood pressure regularly and engages in physical activity, walking about a  mile three to four times a week. She maintains mindful eating habits.  She is concerned about her vitamin D levels, which were last checked in November and found to be 40 ng/mL, within the normal range. She takes 5000 IU of vitamin D three to four times a week and is interested in rechecking her levels to monitor any changes.  She mentions a decrease in the intensity and frequency of a dull thumping sensation in her head, which she previously described on last visit as feeling her heartbeat at the back of her head. .      Patient Active Problem List   Diagnosis Date Noted   Sickle cell trait (HCC) 11/24/2022   Vitamin D deficiency 11/09/2021   Branch retinal vein occlusion with macular edema of left eye 12/31/2020   Obesity (BMI 30.0-34.9) 12/31/2020   Abnormal EKG 12/31/2020   Normocytic anemia 05/11/2012   Sinus tachycardia 05/11/2012     Current Outpatient Medications on File Prior to Visit  Medication Sig Dispense Refill   Ascorbic Acid (VITAMIN C PO) Take by mouth.     B Complex-C (B-COMPLEX WITH VITAMIN C) tablet Take 1 tablet by mouth daily.     Multiple Vitamin (MULTIVITAMIN WITH MINERALS) TABS Take 1 tablet by mouth daily.     VITAMIN D PO Take by mouth.     No current facility-administered medications on file prior to visit.    No Known Allergies  Social History   Socioeconomic History   Marital status: Married    Spouse name: Not on file  Number of children: Not on file   Years of education: Not on file   Highest education level: Not on file  Occupational History   Not on file  Tobacco Use   Smoking status: Never   Smokeless tobacco: Never  Vaping Use   Vaping status: Never Used  Substance and Sexual Activity   Alcohol use: No   Drug use: No   Sexual activity: Yes  Other Topics Concern   Not on file  Social History Narrative   Not on file   Social Drivers of Health   Financial Resource Strain: Low Risk  (12/07/2023)   Overall Financial Resource  Strain (CARDIA)    Difficulty of Paying Living Expenses: Not hard at all  Food Insecurity: No Food Insecurity (12/07/2023)   Hunger Vital Sign    Worried About Running Out of Food in the Last Year: Never true    Ran Out of Food in the Last Year: Never true  Transportation Needs: No Transportation Needs (12/07/2023)   PRAPARE - Administrator, Civil Service (Medical): No    Lack of Transportation (Non-Medical): No  Physical Activity: Sufficiently Active (12/07/2023)   Exercise Vital Sign    Days of Exercise per Week: 3 days    Minutes of Exercise per Session: 60 min  Stress: No Stress Concern Present (12/07/2023)   [employer] of Occupational Health - Occupational Stress Questionnaire    Feeling of Stress : Not at all  Social Connections: Socially Integrated (12/07/2023)   Social Connection and Isolation Panel [NHANES]    Frequency of Communication with Friends and Family: More than three times a week    Frequency of Social Gatherings with Friends and Family: More than three times a week    Attends Religious Services: More than 4 times per year    Active Member of [employer] or Organizations: Yes    Attends Engineer, structural: More than 4 times per year    Marital Status: Married  Catering manager Violence: Not At Risk (12/07/2023)   Humiliation, Afraid, Rape, and Kick questionnaire    Fear of Current or Ex-Partner: No    Emotionally Abused: No    Physically Abused: No    Sexually Abused: No    Family History  Problem Relation Age of Onset   Hypertension Neg Hx    Diabetes Neg Hx    CAD Neg Hx    CVA Neg Hx    Cancer Neg Hx    Colon cancer Neg Hx    Colon polyps Neg Hx    Esophageal cancer Neg Hx    Rectal cancer Neg Hx    Stomach cancer Neg Hx     Past Surgical History:  Procedure Laterality Date   CESAREAN SECTION     x2    ROS: Review of Systems Negative except as stated above  PHYSICAL EXAM: BP (!) 148/75 (BP Location: Left Arm, Patient  Position: Sitting, Cuff Size: Normal)   Pulse (!) 55   Temp 97.9 F (36.6 C) (Oral)   Ht 5\' 4"  (1.626 m)   Wt 174 lb (78.9 kg)   SpO2 98%   BMI 29.87 kg/m   Physical Exam Repeat BP 158/82 General appearance - alert, well appearing, and in no distress Mental status - normal mood, behavior, speech, dress, motor activity, and thought processes Neck - supple, no significant adenopathy Chest - clear to auscultation, no wheezes, rales or rhonchi, symmetric air entry Heart - normal rate, regular rhythm, normal  S1, S2, no murmurs, rubs, clicks or gallops Extremities - peripheral pulses normal, no pedal edema, no clubbing or cyanosis      Latest Ref Rng & Units 09/10/2023    3:17 PM 08/03/2023    2:30 PM 11/09/2021    4:41 PM  CMP  Glucose 70 - 99 mg/dL  84  85   BUN 8 - 27 mg/dL  16  11   Creatinine 6.04 - 1.00 mg/dL  5.40  9.81   Sodium 191 - 144 mmol/L  141  140   Potassium 3.5 - 5.2 mmol/L  5.0  4.4   Chloride 96 - 106 mmol/L  104  104   CO2 20 - 29 mmol/L  25  24   Calcium 8.7 - 10.3 mg/dL  9.3  9.3   Total Protein 6.0 - 8.5 g/dL 6.6  7.0  7.2   Total Bilirubin 0.0 - 1.2 mg/dL 0.3  <4.7  0.2   Alkaline Phos 44 - 121 IU/L 77  74  72   AST 0 - 40 IU/L 30  20  19    ALT 0 - 32 IU/L 30  19  15     Lipid Panel     Component Value Date/Time   CHOL 208 (H) 08/03/2023 1430   TRIG 67 08/03/2023 1430   HDL 76 08/03/2023 1430   CHOLHDL 2.7 08/03/2023 1430   LDLCALC 120 (H) 08/03/2023 1430    CBC    Component Value Date/Time   WBC 5.2 08/03/2023 1430   WBC 8.0 05/12/2012 0615   RBC 4.98 08/03/2023 1430   RBC 2.99 (L) 05/12/2012 0615   HGB 12.5 08/03/2023 1430   HCT 38.6 08/03/2023 1430   PLT 222 08/03/2023 1430   MCV 78 (L) 08/03/2023 1430   MCH 25.1 (L) 08/03/2023 1430   MCH 24.4 (L) 05/12/2012 0615   MCHC 32.4 08/03/2023 1430   MCHC 31.2 05/12/2012 0615   RDW 14.4 08/03/2023 1430   LYMPHSABS 2.7 09/27/2018 1659   MONOABS 1.8 (H) 05/11/2012 0910   EOSABS 0.1 09/27/2018  1659   BASOSABS 0.0 09/27/2018 1659   The 10-year ASCVD risk score (Arnett DK, et al., 2019) is: 12.3%   Values used to calculate the score:     Age: 74 years     Sex: Female     Is Non-Hispanic African American: Yes     Diabetic: No     Tobacco smoker: No     Systolic Blood Pressure: 148 mmHg     Is BP treated: Yes     HDL Cholesterol: 76 mg/dL     Total Cholesterol: 208 mg/dL  ASSESSMENT AND PLAN: 1. Mixed hyperlipidemia (Primary) Continue atorvastatin.  We will recheck liver panel and lipid profile today.  Continue healthy eating habits and regular exercise - atorvastatin (LIPITOR) 10 MG tablet; Take 1 tablet (10 mg total) by mouth daily.  Dispense: 90 tablet; Refill: 1 - Lipid panel - Hepatic Function Panel  2. Vitamin D deficiency Advised that her last vitamin D level was 40 which is normal and pretty solid. - VITAMIN D 25 Hydroxy (Vit-D Deficiency, Fractures)  3. 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  4. White coat syndrome with diagnosis of hypertension Home blood pressure readings are fairly good but always tend to be higher in the office.  Will continue to monitor off medication.  Continue healthy eating habits and regular exercise.   Patient was given the opportunity to ask questions.  Patient verbalized understanding of the plan and was able to repeat key elements of the plan.   This documentation was completed using Paediatric nurse.  Any transcriptional errors are unintentional.  Orders Placed This Encounter   Procedures   Lipid panel   Hepatic Function Panel   VITAMIN D 25 Hydroxy (Vit-D Deficiency, Fractures)     Requested Prescriptions   Signed Prescriptions Disp Refills   atorvastatin (LIPITOR) 10 MG tablet 90 tablet 1    Sig: Take 1 tablet (10 mg total) by mouth daily.    Return in about 4 months (around 04/07/2024).  Jonah Blue, MD, FACP

## 2023-12-07 NOTE — Patient Instructions (Signed)
 VISIT SUMMARY:  Today, we reviewed your conditions including hyperlipidemia, hypertension, and branch retinal vein occlusion with macular edema. We discussed your current medications, recent lab results, and any symptoms or concerns you have. We also talked about the next steps for managing your health.  YOUR PLAN:  -BRANCH RETINAL VEIN OCCLUSION WITH MACULAR EDEMA: This condition involves a blockage in the small veins of the retina, leading to swelling and vision problems. If your vision problem is getting worse, you should consider having the recommended treatments that Dr. Luciana Axe has put forth.  -HYPERLIPIDEMIA: This condition means you have high levels of cholesterol in your blood. You are currently taking atorvastatin 10 mg, which you tolerate well. We will recheck your cholesterol levels and liver function tests to ensure the medication is working effectively.  -HYPERTENSION: This condition means you have high blood pressure. Your blood pressure readings at home are well-controlled, likely elevated in the office due to white coat hypertension. Continue your healthy lifestyle, including regular physical activity and mindful eating.  -VITAMIN D MONITORING: Your vitamin D levels were within the normal range at 40 ng/mL. You are taking 5000 IU of vitamin D three to four times a week. We will recheck your levels to monitor any changes.  INSTRUCTIONS:  Please schedule a follow-up appointment to recheck your cholesterol levels, liver function tests, and vitamin D levels. Continue monitoring your blood pressure at home and maintain your current healthy lifestyle.

## 2023-12-08 ENCOUNTER — Encounter: Payer: Self-pay | Admitting: Internal Medicine

## 2023-12-08 LAB — LIPID PANEL
Chol/HDL Ratio: 2.3 ratio (ref 0.0–4.4)
Cholesterol, Total: 175 mg/dL (ref 100–199)
HDL: 75 mg/dL (ref >39)
LDL Chol Calc (NIH): 90 mg/dL (ref 0–99)
Triglycerides: 47 mg/dL (ref 0–149)
VLDL Cholesterol Cal: 10 mg/dL (ref 5–40)

## 2023-12-08 LAB — HEPATIC FUNCTION PANEL
ALT: 22 IU/L (ref 0–32)
AST: 26 IU/L (ref 0–40)
Albumin: 4.3 g/dL (ref 3.9–4.9)
Alkaline Phosphatase: 83 IU/L (ref 44–121)
Bilirubin Total: 0.2 mg/dL (ref 0.0–1.2)
Bilirubin, Direct: 0.1 mg/dL (ref 0.00–0.40)
Total Protein: 7.2 g/dL (ref 6.0–8.5)

## 2023-12-08 LAB — VITAMIN D 25 HYDROXY (VIT D DEFICIENCY, FRACTURES): Vit D, 25-Hydroxy: 50.9 ng/mL (ref 30.0–100.0)

## 2024-01-14 ENCOUNTER — Telehealth: Payer: Self-pay | Admitting: Internal Medicine

## 2024-01-14 NOTE — Telephone Encounter (Signed)
 Copied from CRM [id number]. Topic: Clinical - Medication Question  >> Jan 14, 2024  2:42 PM Bridgette Campus T wrote: Reason for CRM: atorvastatin  (LIPITOR) 10 MG tablet- patient asking if she needs to continue taking this medication, she stopped taking it once the lab results were posted, please call [phone]

## 2024-01-14 NOTE — Telephone Encounter (Signed)
 I would advise that she continues taking it to keep the cholesterol level under control.

## 2024-01-15 NOTE — Telephone Encounter (Signed)
 Called & spoke to the patient. Verified name & DOB. Informed patient of Dr.Johnson's advise to keep taking cholesterol medication. Patient expressed verbal understanding.

## 2024-02-02 ENCOUNTER — Ambulatory Visit: Admission: EM | Admit: 2024-02-02 | Discharge: 2024-02-02 | Disposition: A | Discharge status: Home / Self Care

## 2024-02-02 ENCOUNTER — Ambulatory Visit (INDEPENDENT_AMBULATORY_CARE_PROVIDER_SITE_OTHER)

## 2024-02-02 DIAGNOSIS — M25561 Pain in right knee: Secondary | ICD-10-CM | POA: Diagnosis not present

## 2024-02-02 DIAGNOSIS — N950 Postmenopausal bleeding: Secondary | ICD-10-CM | POA: Insufficient documentation

## 2024-02-02 DIAGNOSIS — D649 Anemia, unspecified: Secondary | ICD-10-CM | POA: Insufficient documentation

## 2024-02-02 DIAGNOSIS — R7989 Other specified abnormal findings of blood chemistry: Secondary | ICD-10-CM | POA: Insufficient documentation

## 2024-02-02 DIAGNOSIS — S39012A Strain of muscle, fascia and tendon of lower back, initial encounter: Secondary | ICD-10-CM

## 2024-02-02 DIAGNOSIS — N939 Abnormal uterine and vaginal bleeding, unspecified: Secondary | ICD-10-CM | POA: Insufficient documentation

## 2024-02-02 DIAGNOSIS — D509 Iron deficiency anemia, unspecified: Secondary | ICD-10-CM | POA: Insufficient documentation

## 2024-02-02 DIAGNOSIS — G9332 Myalgic encephalomyelitis/chronic fatigue syndrome: Secondary | ICD-10-CM | POA: Insufficient documentation

## 2024-02-02 DIAGNOSIS — M25562 Pain in left knee: Secondary | ICD-10-CM

## 2024-02-02 DIAGNOSIS — M25511 Pain in right shoulder: Secondary | ICD-10-CM

## 2024-02-02 NOTE — ED Provider Notes (Signed)
 [facility]    CSN: [id number] Arrival date & time: 02/02/24  0809      History   Chief Complaint Chief Complaint  Patient presents with   Pain    HPI Kelly Arnold is a 68 y.o. female.   68 year old female who presents urgent care with complaints of bilateral knee pain, right lower back pain and right shoulder pain.  The symptoms have an insidious onset.  The knee pain started in the last 2 to 3 weeks.  She reports that the knees will be very stiff when she first gets up and once she is moving they feel better.  This has caused her to walk somewhat differently.  The knees do not lock or give out.  She has not had any injury to the area.  Since her knees have started hurting she started noticing a tenderness in her right lower back.  This does not radiate.  There is no loss of urine or bowel, weakness, radiating pain, hematuria, dysuria.  She also has some right shoulder pain that has been going on for few weeks as well.  She has not had any injury to this area.  The pain is worse with crossing her arm over her body and with lifting over the head.  She did try to call her primary care physician but was unable to get an appointment so decided to come to urgent care for further evaluation.  She denies any fevers or chills, shortness of breath or cough, chest pain, dizziness, headache or other constitutional symptoms.     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      Home Medications    Prior to Admission medications   Medication Sig Start Date End Date Taking? Authorizing Provider  Ascorbic Acid (VITAMIN C PO) Take by mouth.   Yes [provider]  atorvastatin  (LIPITOR) 10 MG tablet Take 1 tablet (10 mg total) by mouth daily. 12/07/23  Yes Lawrance Presume, MD  B Complex-C (B-COMPLEX WITH VITAMIN C) tablet Take 1 tablet by mouth daily.   Yes [provider]  Cholecalciferol (VITAMIN D3) POWD Take 100,000 Units by mouth daily at 2 PM. 12/20/21  Yes [provider]  Multiple Vitamin (MULTIVITAMIN WITH MINERALS) TABS Take 1 tablet by mouth daily.   Yes [provider]  VITAMIN D  PO Take by mouth.   Yes [provider]  hydrochlorothiazide  (HYDRODIURIL ) 12.5 MG tablet Take 12.5 mg by mouth daily. 10/26/21   [provider]  Multiple Vitamin (MULTIVITAMINS PO) 1 tab Orally qd    [provider]    Family History Family History  Problem Relation Age of Onset   Hypertension Neg Hx    Diabetes Neg Hx    CAD Neg Hx    CVA Neg Hx    Cancer Neg Hx    Colon cancer Neg Hx    Colon polyps Neg Hx    Esophageal cancer Neg Hx    Rectal cancer Neg Hx    Stomach cancer Neg Hx     Social History Social History   Tobacco Use   Smoking status: Never   Smokeless tobacco: Never  Vaping Use   Vaping status: Never Used  Substance Use Topics   Alcohol use: No   Drug use: No     Allergies   Patient has no known allergies.   Review of Systems Review of Systems   Constitutional:  Negative for chills and fever.  HENT:  Negative for ear pain and sore throat.   Eyes:  Negative for pain and visual disturbance.  Respiratory:  Negative for cough and shortness of breath.   Cardiovascular:  Negative for chest pain and palpitations.  Gastrointestinal:  Negative for abdominal pain and vomiting.  Genitourinary:  Negative for dysuria and hematuria.  Musculoskeletal:  Positive for back pain and gait problem. Negative for arthralgias.       Bilateral knee pain  Skin:  Negative for color change and rash.  Neurological:  Negative for seizures and syncope.  All other systems reviewed and are negative.    Physical Exam Triage Vital Signs ED Triage Vitals  Encounter Vitals Group     BP 02/02/24 0841 (!) 149/77     Systolic BP Percentile --      Diastolic BP Percentile --      Pulse Rate 02/02/24 0841 (!) 53     Resp 02/02/24 0841 18     Temp 02/02/24 0841 97.8 F (36.6 C)     Temp Source 02/02/24 0841 Oral     SpO2 02/02/24 0841 97 %     Weight 02/02/24 0839 171 lb (77.6 kg)     Height 02/02/24 0839 5\' 3"  (1.6 m)     Head Circumference --      Peak Flow --      Pain Score 02/02/24 0838 7     Pain Loc --      Pain Education --      Exclude from Growth Chart --    No data found.  Updated Vital Signs BP (!) 149/77 (BP Location: Left Arm)   Pulse (!) 53   Temp 97.8 F (36.6 C) (Oral)   Resp 18   Ht 5\' 3"  (1.6 m)   Wt 171 lb (77.6 kg)   SpO2 97%   BMI 30.29 kg/m   Visual Acuity Right Eye Distance:   Left Eye Distance:   Bilateral Distance:    Right Eye Near:   Left Eye Near:    Bilateral Near:     Physical Exam Vitals and nursing note reviewed.  Constitutional:      General: She is not in acute distress.    Appearance: She is well-developed.  HENT:     Head: Normocephalic and atraumatic.     Right Ear: Tympanic membrane normal.     Left Ear: Tympanic membrane normal.  Eyes:     Conjunctiva/sclera: Conjunctivae normal.   Cardiovascular:     Rate and Rhythm: Normal rate  and regular rhythm.     Heart sounds: No murmur heard. Pulmonary:     Effort: Pulmonary effort is normal. No respiratory distress.     Breath sounds: Normal breath sounds.  Abdominal:     Palpations: Abdomen is soft.     Tenderness: There is no abdominal tenderness.  Musculoskeletal:        General: No swelling.     Cervical back: Neck supple.       Back:     Right knee: No swelling, deformity, erythema or crepitus. Normal range of motion. Tenderness present over the medial joint line. Normal meniscus. Normal pulse.     Instability Tests: Anterior drawer test negative. Posterior drawer test negative. Medial McMurray test negative and lateral McMurray test negative.     Left knee: No swelling, deformity, erythema or crepitus. Normal range of motion. Tenderness present over the medial joint line. Normal meniscus. Normal pulse.     Instability Tests: Anterior drawer test negative. Posterior drawer test negative. Medial McMurray test negative and lateral McMurray test negative.  Skin:    General: Skin is warm and dry.     Capillary Refill: Capillary refill takes less than 2 seconds.  Neurological:     Mental Status: She is alert.  Psychiatric:        Mood and Affect: Mood normal.      UC Treatments / Results  Labs (all labs ordered are listed, but only abnormal results are displayed) Labs Reviewed - No data to display  EKG   Radiology DG Shoulder Right Result Date: 02/02/2024 CLINICAL DATA:  Right shoulder pain ongoing EXAM: RIGHT SHOULDER - 2+ VIEW COMPARISON:  None Available. FINDINGS: Articular  tech glenohumeral articular space narrowing observed with spurring of the inferior glenoid, compatible with osteoarthritis. Substantial spurring of the [facility] joint compatible with osteoarthritis. Subacromial morphology is type 2 (curved). No fracture or dislocation observed. IMPRESSION: 1. 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  Electronically Signed   By: Freida Jes M.D.   On: 02/02/2024 10:31   DG Knee Complete 4 Views Left Result Date: 02/02/2024 CLINICAL DATA:  Left knee pain EXAM: LEFT KNEE - COMPLETE 4+ VIEW COMPARISON:  None Available. FINDINGS: Tricompartmental spurring compatible with osteoarthritis with medial compartmental articular space narrowing and mild narrowing laterally in the patellofemoral joint. Indeterminate for knee effusion due to degree of flexion on lateral projection. No fracture or acute bony findings identified. IMPRESSION: 1. 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 Electronically Signed   By: Freida Jes M.D.   On: 02/02/2024 10:29   DG Knee Complete 4 Views Right Result Date: 02/02/2024 CLINICAL DATA:  Ongoing right knee pain EXAM: RIGHT KNEE - COMPLETE 4+ VIEW COMPARISON:  10/21/2019 FINDINGS: The knee is flexed about 90 degrees on the lateral projection, causing the patella to drop into the intercondylar notch and causing the exam to be indeterminate for knee effusion. Knee flexion of 20 degrees on the lateral projection is recommended in order to optimally be able to assess for knee effusion. Tricompartmental spurring compatible with osteoarthritis. Medial compartmental and patellofemoral articular cartilage thinning. Mild subcortical sclerosis in the medial compartment. IMPRESSION: 1. Tricompartmental osteoarthritis. 2. Indeterminate for knee effusion. Electronically Signed   By: Freida Jes M.D.   On: 02/02/2024 10:28    Procedures Procedures (including critical care time)  Medications Ordered in UC Medications - No data to display  Initial Impression / Assessment and Plan / UC  Course  I have reviewed the triage vital signs and the nursing notes.  Pertinent labs & imaging results that were available during my care of the patient were reviewed by me and considered in  my medical decision making (see chart for details).     Acute pain of right knee - Plan: DG Knee Complete 4 Views Right, DG Knee Complete 4 Views Right  Acute pain of left knee - Plan: DG Knee Complete 4 Views Left, DG Knee Complete 4 Views Left  Acute pain of right shoulder - Plan: DG Shoulder Right, DG Shoulder Right  Strain of lumbar region, initial encounter   X-ray done today of the right knee, left knee and right shoulder.  Final evaluation by the radiologist indicate a level of arthritis in the knees bilateral.  There is also some changes in the shoulder that could be consistent with arthritis as well.  Symptoms are most likely related to arthritic changes and would normally be treated with anti-inflammatories, ibuprofen or aleve.  Follow-up with an orthopedist would be indicated if symptoms are persistent.  Can try using ice on the area 2-3 times daily for 10 to 15 minutes to help with discomfort.  Also recommend using knee compression sleeves or Ace wrap's when you are up walking or standing to help with discomfort and to give the knee support.  The point tenderness in the back is likely associated with musculoskeletal strain from abnormal gait recently with the knee.  There are no concerning symptoms otherwise with this.  Can use a heating pad on the back to help with the symptoms. Can follow-up with your primary care doctor for the symptoms as well.  Final Clinical Impressions(s) / UC Diagnoses   Final diagnoses:  Acute pain of right knee  Acute pain of left knee  Acute pain of right shoulder  Strain of lumbar region, initial encounter     Discharge Instructions      X-ray done today of the right knee, left knee and right shoulder.  Final evaluation by the radiologist indicate a level of arthritis in the knees bilateral.  There is also some changes in the shoulder that could be consistent with arthritis as well.  Symptoms are most likely related to arthritic changes and would  normally be treated with anti-inflammatories, ibuprofen or aleve.  Follow-up with an orthopedist would be indicated if symptoms are persistent.  Can try using ice on the area 2-3 times daily for 10 to 15 minutes to help with discomfort.  Also recommend using knee compression sleeves or Ace wrap's when you are up walking or standing to help with discomfort and to give the knee support.  The point tenderness in the back is likely associated with musculoskeletal strain from abnormal gait recently with the knee.  There are no concerning symptoms otherwise with this.  Can use a heating pad on the back to help with the symptoms. Can follow-up with your primary care doctor for the symptoms as well.   ED Prescriptions   None    PDMP not reviewed this encounter.   Kreg Pesa, PA-C 02/02/24 1051

## 2024-02-02 NOTE — ED Triage Notes (Signed)
"  About 2 wks ago started with knee pain (both) and ongoing". "Pains are also noticed on my right lower back at my hip at times & right shoulder at times". No injuries.

## 2024-02-02 NOTE — Discharge Instructions (Addendum)
 X-ray done today of the right knee, left knee and right shoulder.  Final evaluation by the radiologist indicate a level of arthritis in the knees bilateral.  There is also some changes in the shoulder that could be consistent with arthritis as well.  Symptoms are most likely related to arthritic changes and would normally be treated with anti-inflammatories, ibuprofen or aleve.  Follow-up with an orthopedist would be indicated if symptoms are persistent.  Can try using ice on the area 2-3 times daily for 10 to 15 minutes to help with discomfort.  Also recommend using knee compression sleeves or Ace wrap's when you are up walking or standing to help with discomfort and to give the knee support.  The point tenderness in the back is likely associated with musculoskeletal strain from abnormal gait recently with the knee.  There are no concerning symptoms otherwise with this.  Can use a heating pad on the back to help with the symptoms. Can follow-up with your primary care doctor for the symptoms as well.

## 2024-03-10 ENCOUNTER — Ambulatory Visit: Attending: Internal Medicine | Admitting: Internal Medicine

## 2024-03-10 ENCOUNTER — Encounter: Payer: Self-pay | Admitting: Internal Medicine

## 2024-03-10 VITALS — BP 148/84 | HR 58 | Temp 97.8°F | Ht 63.0 in | Wt 176.0 lb

## 2024-03-10 DIAGNOSIS — E782 Mixed hyperlipidemia: Secondary | ICD-10-CM | POA: Diagnosis not present

## 2024-03-10 DIAGNOSIS — M130 Polyarthritis, unspecified: Secondary | ICD-10-CM | POA: Diagnosis not present

## 2024-03-10 DIAGNOSIS — R002 Palpitations: Secondary | ICD-10-CM

## 2024-03-10 DIAGNOSIS — R030 Elevated blood-pressure reading, without diagnosis of hypertension: Secondary | ICD-10-CM | POA: Diagnosis not present

## 2024-03-10 DIAGNOSIS — I10 Essential (primary) hypertension: Secondary | ICD-10-CM

## 2024-03-10 MED ORDER — DICLOFENAC SODIUM 1 % EX GEL
2.0000 g | Freq: Three times a day (TID) | CUTANEOUS | 2 refills | Status: AC | PRN
Start: 2024-03-10 — End: ?

## 2024-03-10 MED ORDER — VALSARTAN 40 MG PO TABS: 40.0000 mg | ORAL_TABLET | Freq: Every day | ORAL | 3 refills | Status: DC

## 2024-03-10 NOTE — Patient Instructions (Signed)
 VISIT SUMMARY:  During your visit, we discussed your joint pain, blood pressure, and recent episodes of lightheadedness and palpitations. We reviewed your current symptoms and made adjustments to your treatment plan to help manage your conditions more effectively.  YOUR PLAN:  -OSTEOARTHRITIS: Osteoarthritis is a type of arthritis that occurs when the protective cartilage that cushions the ends of your bones wears down over time. To help manage your symptoms, we recommend weight loss to reduce strain on your joints and encourage physical activity, but avoid high-impact exercises. We have prescribed Diclofenac Gel for you to use topically on the affected areas. If your pain persists, we may consider referring you to an orthopedic specialist for possible steroid injections.  -HYPERTENSION: Hypertension, or high blood pressure, is when the force of your blood against the walls of your arteries is consistently too high. We have prescribed valsartan 40 mg daily to help manage your blood pressure. Please monitor your blood pressure at home and we will reassess it during your next visit.  -PALPITATIONS: Palpitations are feelings of having a fast-beating, fluttering, or pounding heart. Given your recent episodes of lightheadedness and rapid heartbeat, we will refer you to a cardiologist for further evaluation. Additionally, we will check your thyroid function to rule out any related issues.  -GENERAL HEALTH MAINTENANCE: To maintain your overall health, we suggest increasing your water intake and avoiding reliance on caffeinated beverages like green tea, as they can contribute to dehydration.  INSTRUCTIONS:  Please follow up with the cardiologist as soon as possible for your palpitations. Continue to monitor your blood pressure at home and keep a record of your readings. We will reassess your blood pressure during your next visit. If your joint pain does not improve with the diclofenac gel, let us  know so we  can consider other treatment options, including a referral to an orthopedic specialist.

## 2024-03-10 NOTE — Progress Notes (Signed)
 Patient ID: Kelly Arnold, female    DOB: 1956/03/30  MRN: [id number]  CC: Knee Pain (R knee pain X1 mo - please see UC notes/R hip and R shoulder - slightly resolved /Discuss atorvastatin  /No to shingles vax)   Subjective: Kelly Arnold is a 68 y.o. female who presents for UC. Her concerns today include:  Pt with hx of HL, HTN + white coat HTN, obesity, vit D def, LT retinal vein occlusion 11/2020 (hypercoag work up negative), COVID infection 09/2020, Mentor trait.  Discussed the use of AI scribe software for clinical note transcription with the patient, who gave verbal consent to proceed.  History of Present Illness Kelly Arnold is a 68 year old female who presents with joint pains.  She experiences significant joint pain in the  lower back midline, both knees, and right shoulder. An urgent care visit on Feb 02, 2024, revealed arthritis in both knees and the right shoulder via x-rays.  She was advised to use ibuprofen as needed to be purchased over-the-counter.  She has not used over-the-counter ibuprofen and stopped her statin medication Atorvastatin  10 mg, suspecting it contributed to her pain. Since discontinuing the statin, the pain is less severe but persists.  Since stopping the statin therapy, pain intensity has gone from 8/10 to 5/10.  She endorses joint stiffness especially in the knees.  Joint stiffness occurs after prolonged sitting or upon waking and improves with movement. No joint swelling is observed.  Blood pressure today noted to be elevated.  Some of her recent blood pressure readings are 135/69, 138/84, 145/74, 152/85, 147/71, 120/80, 143/79.  She experienced a brief episode of lightheadedness and palpitations while driving last week.  She had to pull off the road; symptoms resolved in less than 1 minute. A similar episode occurred two years ago. Pulse rate has been noted in the 50s during recent visits. She maintains hydration by carrying a water bottle and drinking green  tea. Her typical morning intake includes half a bagel and fruits. She continues to engage in walking for exercise, although knee pain sometimes affects her ability to walk comfortably.    Patient Active Problem List   Diagnosis Date Noted   Abnormal liver function tests 02/02/2024   Chronic fatigue syndrome 02/02/2024   Iron deficiency anemia 02/02/2024   Postmenopausal vaginal bleeding 02/02/2024   Vaginal bleeding 02/02/2024   Anemia 02/02/2024   Sickle cell trait (HCC) 11/24/2022   Hypertensive retinopathy of both eyes 12/20/2021   Retinal edema 12/20/2021   Nuclear sclerotic cataract of both eyes 12/20/2021   Branch retinal vein occlusion of left eye with macular edema 12/20/2021   Vitamin D  deficiency 11/09/2021   Branch retinal vein occlusion with macular edema of left eye 12/31/2020   Obesity (BMI 30.0-34.9) 12/31/2020   Abnormal EKG 12/31/2020   Normocytic anemia 05/11/2012   Sinus tachycardia 05/11/2012     Current Outpatient Medications on File Prior to Visit  Medication Sig Dispense Refill   Ascorbic Acid (VITAMIN C PO) Take by mouth.     B Complex-C (B-COMPLEX WITH VITAMIN C) tablet Take 1 tablet by mouth daily.     Cholecalciferol (VITAMIN D3) POWD Take 100,000 Units by mouth daily at 2 PM.     Multiple Vitamin (MULTIVITAMIN WITH MINERALS) TABS Take 1 tablet by mouth daily.     Multiple Vitamin (MULTIVITAMINS PO) 1 tab Orally qd     VITAMIN D  PO Take by mouth.     atorvastatin  (LIPITOR) 10 MG tablet Take  1 tablet (10 mg total) by mouth daily. (Patient not taking: Reported on 03/10/2024) 90 tablet 1   No current facility-administered medications on file prior to visit.    No Known Allergies  Social History   Socioeconomic History   Marital status: Married    Spouse name: Not on file   Number of children: Not on file   Years of education: Not on file   Highest education level: Not on file  Occupational History   Not on file  Tobacco Use   Smoking status:  Never   Smokeless tobacco: Never  Vaping Use   Vaping status: Never Used  Substance and Sexual Activity   Alcohol use: No   Drug use: No   Sexual activity: Not Currently  Other Topics Concern   Not on file  Social History Narrative   Not on file   Social Drivers of Health   Financial Resource Strain: Low Risk  (12/07/2023)   Overall Financial Resource Strain (CARDIA)    Difficulty of Paying Living Expenses: Not hard at all  Food Insecurity: No Food Insecurity (12/07/2023)   Hunger Vital Sign    Worried About Running Out of Food in the Last Year: Never true    Ran Out of Food in the Last Year: Never true  Transportation Needs: No Transportation Needs (12/07/2023)   PRAPARE - Administrator, Civil Service (Medical): No    Lack of Transportation (Non-Medical): No  Physical Activity: Sufficiently Active (12/07/2023)   Exercise Vital Sign    Days of Exercise per Week: 3 days    Minutes of Exercise per Session: 60 min  Stress: No Stress Concern Present (12/07/2023)   [employer] of Occupational Health - Occupational Stress Questionnaire    Feeling of Stress : Not at all  Social Connections: Socially Integrated (12/07/2023)   Social Connection and Isolation Panel    Frequency of Communication with Friends and Family: More than three times a week    Frequency of Social Gatherings with Friends and Family: More than three times a week    Attends Religious Services: More than 4 times per year    Active Member of [employer] or Organizations: Yes    Attends Engineer, structural: More than 4 times per year    Marital Status: Married  Catering manager Violence: Not At Risk (12/07/2023)   Humiliation, Afraid, Rape, and Kick questionnaire    Fear of Current or Ex-Partner: No    Emotionally Abused: No    Physically Abused: No    Sexually Abused: No    Family History  Problem Relation Age of Onset   Hypertension Neg Hx    Diabetes Neg Hx    CAD Neg Hx    CVA Neg Hx     Cancer Neg Hx    Colon cancer Neg Hx    Colon polyps Neg Hx    Esophageal cancer Neg Hx    Rectal cancer Neg Hx    Stomach cancer Neg Hx     Past Surgical History:  Procedure Laterality Date   CESAREAN SECTION     x2    ROS: Review of Systems Negative except as stated above  PHYSICAL EXAM: BP (!) 148/84   Pulse (!) 58   Temp 97.8 F (36.6 C) (Oral)   Ht 5' 3 (1.6 m)   Wt 176 lb (79.8 kg)   SpO2 99%   BMI 31.18 kg/m   Physical Exam  General appearance - alert, well  appearing, older African female and in no distress Mental status - normal mood, behavior, speech, dress, motor activity, and thought processes Chest - clear to auscultation, no wheezes, rales or rhonchi, symmetric air entry Heart - bradycardic but regular Musculoskeletal -knees: Joints are enlarged.  Mild tenderness on palpation of the medial aspect and medial joint line of the left knee.  Mild crepitus on passive range of motion of both knees right greater than left.  She has good range of motion. Right shoulder: No point tenderness.  She has mild discomfort with attempted passive range of motion but is able to elevate above the head.  Drop arm test positive. No tenderness on palpation of the lumbosacral spine or surrounding paraspinal muscles.  X-rays done 02/06/2024 X-ray of the right shoulder: IMPRESSION: 1. 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  X-ray of the left knee: IMPRESSION: 1. 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  X-ray of the right knee: IMPRESSION: 1. Tricompartmental osteoarthritis. 2. Indeterminate for knee effusion.         Latest Ref Rng & Units 12/07/2023    3:26 PM 09/10/2023    3:17 PM 08/03/2023    2:30 PM  CMP  Glucose 70 - 99 mg/dL   84   BUN 8 - 27 mg/dL   16   Creatinine 9.42 - 1.00 mg/dL   9.23   Sodium 865 - 855 mmol/L   141   Potassium 3.5 - 5.2  mmol/L   5.0   Chloride 96 - 106 mmol/L   104   CO2 20 - 29 mmol/L   25   Calcium  8.7 - 10.3 mg/dL   9.3   Total Protein 6.0 - 8.5 g/dL 7.2  6.6  7.0   Total Bilirubin 0.0 - 1.2 mg/dL 0.2  0.3  <9.7   Alkaline Phos 44 - 121 IU/L 83  77  74   AST 0 - 40 IU/L 26  30  20    ALT 0 - 32 IU/L 22  30  19     Lipid Panel     Component Value Date/Time   CHOL 175 12/07/2023 1526   TRIG 47 12/07/2023 1526   HDL 75 12/07/2023 1526   CHOLHDL 2.3 12/07/2023 1526   LDLCALC 90 12/07/2023 1526    CBC    Component Value Date/Time   WBC 5.2 08/03/2023 1430   WBC 8.0 05/12/2012 0615   RBC 4.98 08/03/2023 1430   RBC 2.99 (L) 05/12/2012 0615   HGB 12.5 08/03/2023 1430   HCT 38.6 08/03/2023 1430   PLT 222 08/03/2023 1430   MCV 78 (L) 08/03/2023 1430   MCH 25.1 (L) 08/03/2023 1430   MCH 24.4 (L) 05/12/2012 0615   MCHC 32.4 08/03/2023 1430   MCHC 31.2 05/12/2012 0615   RDW 14.4 08/03/2023 1430   LYMPHSABS 2.7 09/27/2018 1659   MONOABS 1.8 (H) 05/11/2012 0910   EOSABS 0.1 09/27/2018 1659   BASOSABS 0.0 09/27/2018 1659    ASSESSMENT AND PLAN: 1. Polyarthritis of multiple sites (Primary) Patient with osteoarthritis in multiple joints including the knees and right shoulder.  Likely has osteoarthritis of the lumbosacral spine as well.  Discussed the importance of weight loss.  Encouraged her to continue moving for exercise. -Discussed using an anti-inflammatory like meloxicam or Celebrex to take daily versus an anti-inflammatory gel like Voltaren gel.  Also discussed option of sending her to orthopedics for joint injections.  She prefers to try  the Voltaren gel first. - diclofenac Sodium (VOLTAREN) 1 % GEL; Apply 2 g topically every 8 (eight) hours as needed.  Dispense: 100 g; Refill: 2  2. Mixed hyperlipidemia Symptoms in the joints seem more consistent with osteoarthritis rather than Lipitor though Lipitor can sometimes cause not just muscle aches but joint aches.  She has been off the Lipitor for  about a month and reports some decreased in the joint pains.  I will have her hold off on taking it until I see her again in 1 month.  3. White coat syndrome with diagnosis of hypertension Home blood pressure readings have been above goal.  I recommend starting Diovan 40 mg daily. - valsartan (DIOVAN) 40 MG tablet; Take 1 tablet (40 mg total) by mouth daily.  Dispense: 30 tablet; Refill: 3  4. Palpitations - TSH+T4F+T3Free - Ambulatory referral to Cardiology    Patient was given the opportunity to ask questions.  Patient verbalized understanding of the plan and was able to repeat key elements of the plan.   This documentation was completed using Paediatric nurse.  Any transcriptional errors are unintentional.  Orders Placed This Encounter  Procedures   TSH+T4F+T3Free   Ambulatory referral to Cardiology     Requested Prescriptions   Signed Prescriptions Disp Refills   valsartan (DIOVAN) 40 MG tablet 30 tablet 3    Sig: Take 1 tablet (40 mg total) by mouth daily.   diclofenac Sodium (VOLTAREN) 1 % GEL 100 g 2    Sig: Apply 2 g topically every 8 (eight) hours as needed.    No follow-ups on file.  Barnie Louder, MD, FACP

## 2024-03-11 ENCOUNTER — Ambulatory Visit: Payer: Self-pay | Admitting: Internal Medicine

## 2024-03-11 LAB — TSH+T4F+T3FREE
Free T4: 1.11 ng/dL (ref 0.82–1.77)
T3, Free: 2.7 pg/mL (ref 2.0–4.4)
TSH: 5.05 u[IU]/mL — ABNORMAL HIGH (ref 0.450–4.500)

## 2024-04-02 ENCOUNTER — Telehealth: Payer: Self-pay | Admitting: Internal Medicine

## 2024-04-02 NOTE — Telephone Encounter (Addendum)
 Called patient verified name and date of birth. Patient has been scheduled for a follow up at the end of september. 06/06/2024 at 3:10 pm. Patient acknowledged appointment.

## 2024-04-02 NOTE — Telephone Encounter (Signed)
-----   Message from Barnie Louder sent at 04/02/2024  3:28 PM EDT ----- Please cancel patient's appointment for the 28th of this month and reschedule her for the end of September.

## 2024-04-07 ENCOUNTER — Ambulatory Visit: Admitting: Internal Medicine

## 2024-04-17 ENCOUNTER — Encounter: Payer: Self-pay | Admitting: Internal Medicine

## 2024-06-01 ENCOUNTER — Other Ambulatory Visit: Payer: Self-pay | Admitting: Internal Medicine

## 2024-06-01 DIAGNOSIS — E782 Mixed hyperlipidemia: Secondary | ICD-10-CM

## 2024-06-02 NOTE — Progress Notes (Signed)
 Creed Louder, MD Reason for referral-palpitations  HPI: 68 year old female for evaluation of palpitations at request of Barnie Louder, MD.  Recent laboratories show TSH 5.050 but normal free T4.  Patient describes intermittent palpitations described as a flutter.  Short-lived.  Otherwise denies dyspnea on exertion, orthopnea, PND, pedal edema, exertional chest pain or syncope.  She recently had an episode of fluttering associated with some dizziness transiently while driving but again no syncope.  Cardiology now asked to evaluate.  Current Outpatient Medications  Medication Sig Dispense Refill   Ascorbic Acid (VITAMIN C PO) Take by mouth.     B Complex-C (B-COMPLEX WITH VITAMIN C) tablet Take 1 tablet by mouth daily.     diclofenac  Sodium (VOLTAREN ) 1 % GEL Apply 2 g topically every 8 (eight) hours as needed. 100 g 2   Multiple Vitamin (MULTIVITAMINS PO) 1 tab Orally qd     valsartan  (DIOVAN ) 40 MG tablet Take 1 tablet (40 mg total) by mouth daily. 90 tablet 1   VITAMIN D  PO Take by mouth.     No current facility-administered medications for this visit.    No Known Allergies   Past Medical History:  Diagnosis Date   History of transfusion 2013   Hyperlipidemia    Hypertension    Normocytic anemia     Past Surgical History:  Procedure Laterality Date   CESAREAN SECTION     x2    Social History   Socioeconomic History   Marital status: Married    Spouse name: Not on file   Number of children: 4   Years of education: Not on file   Highest education level: Not on file  Occupational History   Not on file  Tobacco Use   Smoking status: Never   Smokeless tobacco: Never  Vaping Use   Vaping status: Never Used  Substance and Sexual Activity   Alcohol use: No   Drug use: No   Sexual activity: Not Currently  Other Topics Concern   Not on file  Social History Narrative   Not on file   Social Drivers of Health   Financial Resource Strain: Low Risk   (12/07/2023)   Overall Financial Resource Strain (CARDIA)    Difficulty of Paying Living Expenses: Not hard at all  Food Insecurity: No Food Insecurity (12/07/2023)   Hunger Vital Sign    Worried About Running Out of Food in the Last Year: Never true    Ran Out of Food in the Last Year: Never true  Transportation Needs: No Transportation Needs (12/07/2023)   PRAPARE - Administrator, Civil Service (Medical): No    Lack of Transportation (Non-Medical): No  Physical Activity: Sufficiently Active (12/07/2023)   Exercise Vital Sign    Days of Exercise per Week: 3 days    Minutes of Exercise per Session: 60 min  Stress: No Stress Concern Present (12/07/2023)   [employer] of Occupational Health - Occupational Stress Questionnaire    Feeling of Stress : Not at all  Social Connections: Socially Integrated (12/07/2023)   Social Connection and Isolation Panel    Frequency of Communication with Friends and Family: More than three times a week    Frequency of Social Gatherings with Friends and Family: More than three times a week    Attends Religious Services: More than 4 times per year    Active Member of [employer] or Organizations: Yes    Attends Banker Meetings: More than 4 times  per year    Marital Status: Married  Catering manager Violence: Not At Risk (12/07/2023)   Humiliation, Afraid, Rape, and Kick questionnaire    Fear of Current or Ex-Partner: No    Emotionally Abused: No    Physically Abused: No    Sexually Abused: No    Family History  Problem Relation Age of Onset   Hypertension Mother    Diabetes Neg Hx    CAD Neg Hx    CVA Neg Hx    Cancer Neg Hx    Colon cancer Neg Hx    Colon polyps Neg Hx    Esophageal cancer Neg Hx    Rectal cancer Neg Hx    Stomach cancer Neg Hx     ROS: no fevers or chills, productive cough, hemoptysis, dysphasia, odynophagia, melena, hematochezia, dysuria, hematuria, rash, seizure activity, orthopnea, PND, pedal edema,  claudication. Remaining systems are negative.  Physical Exam:   Blood pressure 134/77, pulse (!) 58, height 5' 4 (1.626 m), weight 178 lb (80.7 kg), SpO2 99%.  General:  Well developed/well nourished in NAD Skin warm/dry Patient not depressed No peripheral clubbing Back-normal HEENT-normal/normal eyelids Neck supple/normal carotid upstroke bilaterally; no bruits; no JVD; no thyromegaly chest - CTA/ normal expansion CV - RRR/normal S1 and S2; no murmurs, rubs or gallops;  PMI nondisplaced Abdomen -NT/ND, no HSM, no mass, + bowel sounds, no bruit 2+ femoral pulses, no bruits Ext-no edema, chords, 2+ DP Neuro-grossly nonfocal  EKG Interpretation Date/Time:  Friday June 13 2024 09:59:10 EDT Ventricular Rate:  57 PR Interval:  154 QRS Duration:  86 QT Interval:  382 QTC Calculation: 371 R Axis:   36  Text Interpretation: Sinus bradycardia T wave abnormality, consider inferior ischemia T wave abnormality, consider anterolateral ischemia Confirmed by Pietro Rogue ([id number]) on 06/13/2024 10:00:13 AM  Anterior/inferior T wave inversions unchanged compared to December 23, 2020.  A/P  1 palpitations-symptoms are relatively infrequent and short-lived.  If they worsen we will consider a monitor in the future.  Will arrange echocardiogram to assess LV function.  2 abnormal ECG-anterior and inferior T wave inversion noted on ECG.  However unchanged compared to 2022.  As above we will plan echocardiogram to assess LV function.  Rogue Pietro, MD

## 2024-06-05 ENCOUNTER — Telehealth: Payer: Self-pay | Admitting: Internal Medicine

## 2024-06-05 NOTE — Telephone Encounter (Signed)
 LVM to confirm appt for 9/26

## 2024-06-06 ENCOUNTER — Ambulatory Visit: Attending: Internal Medicine | Admitting: Internal Medicine

## 2024-06-06 ENCOUNTER — Encounter: Payer: Self-pay | Admitting: Internal Medicine

## 2024-06-06 VITALS — BP 115/68 | HR 52 | Temp 97.6°F | Ht 63.0 in | Wt 176.0 lb

## 2024-06-06 DIAGNOSIS — Z1231 Encounter for screening mammogram for malignant neoplasm of breast: Secondary | ICD-10-CM

## 2024-06-06 DIAGNOSIS — R7989 Other specified abnormal findings of blood chemistry: Secondary | ICD-10-CM | POA: Diagnosis not present

## 2024-06-06 DIAGNOSIS — Z79899 Other long term (current) drug therapy: Secondary | ICD-10-CM | POA: Diagnosis not present

## 2024-06-06 DIAGNOSIS — Z2821 Immunization not carried out because of patient refusal: Secondary | ICD-10-CM

## 2024-06-06 DIAGNOSIS — I10 Essential (primary) hypertension: Secondary | ICD-10-CM

## 2024-06-06 MED ORDER — VALSARTAN 40 MG PO TABS: 40.0000 mg | ORAL_TABLET | Freq: Every day | ORAL | 1 refills | Status: AC

## 2024-06-06 NOTE — Patient Instructions (Signed)
  VISIT SUMMARY: During your follow-up visit, we discussed your hypertension, palpitations, thyroid function, and general health maintenance. Your blood pressure readings have been stable, and you have not experienced any further episodes of palpitations. We also reviewed your thyroid levels and general health maintenance, including upcoming appointments and lifestyle recommendations.  YOUR PLAN: -PALPITATIONS: Palpitations are feelings of having rapid, fluttering, or pounding heartbeats. You have not experienced any further episodes since your last visit. Please attend your cardiology appointment on October 3rd for further evaluation. We will recheck your thyroid function at your next visit.  -THYROID FUNCTION ABNORMALITY: One of your thyroid hormone levels was slightly elevated. We will recheck this today  -HYPERTENSION: Hypertension, or high blood pressure, is well-controlled with your current medication, valsartan  40 mg daily. Your recent blood pressure readings have been good. Continue taking valsartan  as prescribed and maintain your healthy lifestyle, including regular exercise and dietary changes.  -GENERAL HEALTH MAINTENANCE: You are due for a mammogram and a bone density study. You have declined the flu shot. Continue taking multivitamins and vitamin C as needed. We will discuss the bone density study at a future visit after your cardiology and mammogram appointments.  INSTRUCTIONS: Please attend your cardiology appointment on October 3rd for further evaluation of your palpitations. We will recheck your thyroid function at your next visit. Continue taking valsartan  40 mg daily and maintain your healthy lifestyle. A mammogram will be scheduled by the breast center, and we will discuss the bone density study at a future visit.                      Contains text generated by [employer]bridge.                                 Contains text generated by  [employer]bridge.

## 2024-06-06 NOTE — Progress Notes (Signed)
 Patient ID: Kelly Arnold, female    DOB: [birth year]  MRN: [id number]  CC: Chronic Disease Management (Chronic Disease Management. Med refills. /Requesting blood work Janiece to mammogram. No to flu vax. )   Subjective: Kelly Arnold is a 68 y.o. female who presents for chronic ds management. Her concerns today include:  Pt with hx of HL, HTN + white coat HTN, obesity, vit D def, LT retinal vein occlusion 11/2020 (hypercoag work up negative), COVID infection 09/2020, [place] trait.  Discussed the use of AI scribe software for clinical note transcription with the patient, who gave verbal consent to proceed.  Discussed the use of AI scribe software for clinical note transcription with the patient, who gave verbal consent to proceed.  History of Present Illness Kelly Arnold is a 68 year old female with hypertension who presents for a follow-up visit.  Her blood pressure readings have been stable since her last visit, with recent measurements including 103/72, 122/74, 130/70, and 126/77. She is currently taking valsartan  40 mg daily, which she believes is helping her overall circulation and eye health.  She maintains a regular exercise routine, walking at least three times a week, and continues to follow a healthy diet with limited salt intake. She has stopped taking atorvastatin  as her cholesterol levels have normalized, and she is not currently taking vitamin D  supplements regularly, though she occasionally takes a multivitamin, vitamin C, and a B complex supplement.   She has an upcoming cardiology appointment on October 3rd for palpitations, but she has not experienced any further episodes since her last visit. No further episodes of palpitations have been reported.  Her thyroid levels were previously checked, with TSH mildly elevated. We plan to recheck today.  HM: She declines flu shot.    Patient Active Problem List   Diagnosis Date Noted   Abnormal liver function tests 02/02/2024    Chronic fatigue syndrome 02/02/2024   Iron deficiency anemia 02/02/2024   Postmenopausal vaginal bleeding 02/02/2024   Vaginal bleeding 02/02/2024   Anemia 02/02/2024   Sickle cell trait 11/24/2022   Hypertensive retinopathy of both eyes 12/20/2021   Retinal edema 12/20/2021   Nuclear sclerotic cataract of both eyes 12/20/2021   Branch retinal vein occlusion of left eye with macular edema 12/20/2021   Vitamin D  deficiency 11/09/2021   Branch retinal vein occlusion with macular edema of left eye 12/31/2020   Obesity (BMI 30.0-34.9) 12/31/2020   Abnormal EKG 12/31/2020   Normocytic anemia 05/11/2012   Sinus tachycardia 05/11/2012     Current Outpatient Medications on File Prior to Visit  Medication Sig Dispense Refill   Multiple Vitamin (MULTIVITAMINS PO) 1 tab Orally qd     Ascorbic Acid (VITAMIN C PO) Take by mouth. (Patient not taking: Reported on 06/06/2024)     B Complex-C (B-COMPLEX WITH VITAMIN C) tablet Take 1 tablet by mouth daily. (Patient not taking: Reported on 06/06/2024)     diclofenac  Sodium (VOLTAREN ) 1 % GEL Apply 2 g topically every 8 (eight) hours as needed. (Patient not taking: Reported on 06/06/2024) 100 g 2   VITAMIN D  PO Take by mouth. (Patient not taking: Reported on 06/06/2024)     No current facility-administered medications on file prior to visit.    No Known Allergies  Social History   Socioeconomic History   Marital status: Married    Spouse name: Not on file   Number of children: Not on file   Years of education: Not on file   Highest education  level: Not on file  Occupational History   Not on file  Tobacco Use   Smoking status: Never   Smokeless tobacco: Never  Vaping Use   Vaping status: Never Used  Substance and Sexual Activity   Alcohol use: No   Drug use: No   Sexual activity: Not Currently  Other Topics Concern   Not on file  Social History Narrative   Not on file   Social Drivers of Health   Financial Resource Strain: Low Risk   (12/07/2023)   Overall Financial Resource Strain (CARDIA)    Difficulty of Paying Living Expenses: Not hard at all  Food Insecurity: No Food Insecurity (12/07/2023)   Hunger Vital Sign    Worried About Running Out of Food in the Last Year: Never true    Ran Out of Food in the Last Year: Never true  Transportation Needs: No Transportation Needs (12/07/2023)   PRAPARE - Administrator, Civil Service (Medical): No    Lack of Transportation (Non-Medical): No  Physical Activity: Sufficiently Active (12/07/2023)   Exercise Vital Sign    Days of Exercise per Week: 3 days    Minutes of Exercise per Session: 60 min  Stress: No Stress Concern Present (12/07/2023)   [employer] of Occupational Health - Occupational Stress Questionnaire    Feeling of Stress : Not at all  Social Connections: Socially Integrated (12/07/2023)   Social Connection and Isolation Panel    Frequency of Communication with Friends and Family: More than three times a week    Frequency of Social Gatherings with Friends and Family: More than three times a week    Attends Religious Services: More than 4 times per year    Active Member of [employer] or Organizations: Yes    Attends Engineer, structural: More than 4 times per year    Marital Status: Married  Catering manager Violence: Not At Risk (12/07/2023)   Humiliation, Afraid, Rape, and Kick questionnaire    Fear of Current or Ex-Partner: No    Emotionally Abused: No    Physically Abused: No    Sexually Abused: No    Family History  Problem Relation Age of Onset   Hypertension Neg Hx    Diabetes Neg Hx    CAD Neg Hx    CVA Neg Hx    Cancer Neg Hx    Colon cancer Neg Hx    Colon polyps Neg Hx    Esophageal cancer Neg Hx    Rectal cancer Neg Hx    Stomach cancer Neg Hx     Past Surgical History:  Procedure Laterality Date   CESAREAN SECTION     x2    ROS: Review of Systems Negative except as stated above  PHYSICAL EXAM: BP 115/68 (BP  Location: Left Arm, Patient Position: Sitting, Cuff Size: Normal)   Pulse (!) 52   Temp 97.6 F (36.4 C) (Oral)   Ht 5' 3 (1.6 m)   Wt 176 lb (79.8 kg)   SpO2 98%   BMI 31.18 kg/m   Physical Exam  General appearance - alert, well appearing, and in no distress Mental status - normal mood, behavior, speech, dress, motor activity, and thought processes Neck - supple, no significant adenopathy.  No thyromegaly or thyroid nodules. Chest - clear to auscultation, no wheezes, rales or rhonchi, symmetric air entry Heart - normal rate, regular rhythm, normal S1, S2, no murmurs, rubs, clicks or gallops Extremities - peripheral pulses normal, no pedal  edema, no clubbing or cyanosis      Latest Ref Rng & Units 12/07/2023    3:26 PM 09/10/2023    3:17 PM 08/03/2023    2:30 PM  CMP  Glucose 70 - 99 mg/dL   84   BUN 8 - 27 mg/dL   16   Creatinine 9.42 - 1.00 mg/dL   9.23   Sodium 865 - 855 mmol/L   141   Potassium 3.5 - 5.2 mmol/L   5.0   Chloride 96 - 106 mmol/L   104   CO2 20 - 29 mmol/L   25   Calcium  8.7 - 10.3 mg/dL   9.3   Total Protein 6.0 - 8.5 g/dL 7.2  6.6  7.0   Total Bilirubin 0.0 - 1.2 mg/dL 0.2  0.3  <9.7   Alkaline Phos 44 - 121 IU/L 83  77  74   AST 0 - 40 IU/L 26  30  20    ALT 0 - 32 IU/L 22  30  19     Lipid Panel     Component Value Date/Time   CHOL 175 12/07/2023 1526   TRIG 47 12/07/2023 1526   HDL 75 12/07/2023 1526   CHOLHDL 2.3 12/07/2023 1526   LDLCALC 90 12/07/2023 1526    CBC    Component Value Date/Time   WBC 5.2 08/03/2023 1430   WBC 8.0 05/12/2012 0615   RBC 4.98 08/03/2023 1430   RBC 2.99 (L) 05/12/2012 0615   HGB 12.5 08/03/2023 1430   HCT 38.6 08/03/2023 1430   PLT 222 08/03/2023 1430   MCV 78 (L) 08/03/2023 1430   MCH 25.1 (L) 08/03/2023 1430   MCH 24.4 (L) 05/12/2012 0615   MCHC 32.4 08/03/2023 1430   MCHC 31.2 05/12/2012 0615   RDW 14.4 08/03/2023 1430   LYMPHSABS 2.7 09/27/2018 1659   MONOABS 1.8 (H) 05/11/2012 0910   EOSABS 0.1  09/27/2018 1659   BASOSABS 0.0 09/27/2018 1659    ASSESSMENT AND PLAN: 1. Essential hypertension (Primary) At goal.  Continue Diovan  40 mg daily, healthy eating habits and regular exercise. - valsartan  (DIOVAN ) 40 MG tablet; Take 1 tablet (40 mg total) by mouth daily.  Dispense: 90 tablet; Refill: 1 - Basic Metabolic Panel  2. Abnormal TSH - TSH+T4F+T3Free  3. Encounter for screening mammogram for malignant neoplasm of breast - MM 3D SCREENING MAMMOGRAM BILATERAL BREAST; Future  4. Influenza vaccination declined  Patient was given the opportunity to ask questions.  Patient verbalized understanding of the plan and was able to repeat key elements of the plan.   This documentation was completed using Paediatric nurse.  Any transcriptional errors are unintentional.  Orders Placed This Encounter  Procedures   MM 3D SCREENING MAMMOGRAM BILATERAL BREAST   TSH+T4F+T3Free   Basic Metabolic Panel     Requested Prescriptions   Signed Prescriptions Disp Refills   valsartan  (DIOVAN ) 40 MG tablet 90 tablet 1    Sig: Take 1 tablet (40 mg total) by mouth daily.    Return in about 4 months (around 10/06/2024).  Barnie Louder, MD, FACP

## 2024-06-07 ENCOUNTER — Other Ambulatory Visit: Payer: Self-pay | Admitting: Internal Medicine

## 2024-06-07 ENCOUNTER — Ambulatory Visit: Payer: Self-pay | Admitting: Internal Medicine

## 2024-06-07 DIAGNOSIS — E875 Hyperkalemia: Secondary | ICD-10-CM

## 2024-06-07 LAB — TSH+T4F+T3FREE
Free T4: 1.19 ng/dL (ref 0.82–1.77)
T3, Free: 2.2 pg/mL (ref 2.0–4.4)
TSH: 5.73 u[IU]/mL — ABNORMAL HIGH (ref 0.450–4.500)

## 2024-06-07 LAB — BASIC METABOLIC PANEL WITH GFR
BUN/Creatinine Ratio: 15 (ref 12–28)
BUN: 11 mg/dL (ref 8–27)
CO2: 24 mmol/L (ref 20–29)
Calcium: 10.1 mg/dL (ref 8.7–10.3)
Chloride: 103 mmol/L (ref 96–106)
Creatinine, Ser: 0.75 mg/dL (ref 0.57–1.00)
Glucose: 77 mg/dL (ref 70–99)
Potassium: 5.4 mmol/L — ABNORMAL HIGH (ref 3.5–5.2)
Sodium: 141 mmol/L (ref 134–144)
eGFR: 88 mL/min/1.73 (ref >59)

## 2024-06-13 ENCOUNTER — Ambulatory Visit: Attending: Cardiology | Admitting: Cardiology

## 2024-06-13 ENCOUNTER — Encounter: Payer: Self-pay | Admitting: Cardiology

## 2024-06-13 VITALS — BP 134/77 | HR 58 | Ht 64.0 in | Wt 178.0 lb

## 2024-06-13 DIAGNOSIS — R9431 Abnormal electrocardiogram [ECG] [EKG]: Secondary | ICD-10-CM

## 2024-06-13 DIAGNOSIS — R002 Palpitations: Secondary | ICD-10-CM | POA: Diagnosis not present

## 2024-06-13 NOTE — Patient Instructions (Signed)
  Testing/Procedures:  Your physician has requested that you have an echocardiogram. Echocardiography is a painless test that uses sound waves to create images of your heart. It provides your doctor with information about the size and shape of your heart and how well your heart's chambers and valves are working. This procedure takes approximately one hour. There are no restrictions for this procedure. Please do NOT wear cologne, perfume, aftershave, or lotions (deodorant is allowed). Please arrive 15 minutes prior to your appointment time.  Please note: We ask at that you not bring children with you during ultrasound (echo/ vascular) testing. Due to room size and safety concerns, children are not allowed in the ultrasound rooms during exams. Our front office staff cannot provide observation of children in our lobby area while testing is being conducted. An adult accompanying a patient to their appointment will only be allowed in the ultrasound room at the discretion of the ultrasound technician under special circumstances. We apologize for any inconvenience. [address]  Follow-Up: At [facility], you and your health needs are our priority.  As part of our continuing mission to provide you with exceptional heart care, our providers are all part of one team.  This team includes your primary Cardiologist (physician) and Advanced Practice Providers or APPs (Physician Assistants and Nurse Practitioners) who all work together to provide you with the care you need, when you need it.  Your next appointment:   12 month(s)  Provider:   Alexandria Angel MD

## 2024-06-27 ENCOUNTER — Ambulatory Visit
Admission: RE | Admit: 2024-06-27 | Discharge: 2024-06-27 | Disposition: A | Discharge status: Home / Self Care | Source: Ambulatory Visit | Attending: Internal Medicine | Admitting: Internal Medicine

## 2024-06-27 DIAGNOSIS — Z1231 Encounter for screening mammogram for malignant neoplasm of breast: Secondary | ICD-10-CM

## 2024-06-30 ENCOUNTER — Ambulatory Visit: Attending: Family Medicine

## 2024-06-30 DIAGNOSIS — E875 Hyperkalemia: Secondary | ICD-10-CM

## 2024-07-01 LAB — POTASSIUM: Potassium: 4.6 mmol/L (ref 3.5–5.2)

## 2024-07-02 ENCOUNTER — Ambulatory Visit: Payer: Self-pay | Admitting: Internal Medicine

## 2024-07-12 ENCOUNTER — Other Ambulatory Visit: Payer: Self-pay | Admitting: Internal Medicine

## 2024-07-12 DIAGNOSIS — I10 Essential (primary) hypertension: Secondary | ICD-10-CM

## 2024-07-14 NOTE — Telephone Encounter (Signed)
 Requested Prescriptions  Refused Prescriptions Disp Refills   valsartan  (DIOVAN ) 40 MG tablet [Pharmacy Med Name: VALSARTAN  40 MG TABLET] 30 tablet 3    Sig: TAKE 1 TABLET BY MOUTH EVERY DAY     Cardiovascular:  Angiotensin Receptor Blockers Passed - 07/14/2024  5:47 PM      Passed - Cr in normal range and within 180 days    Creatinine, Ser  Date Value Ref Range Status  06/06/2024 0.75 0.57 - 1.00 mg/dL Final         Passed - K in normal range and within 180 days    Potassium  Date Value Ref Range Status  06/30/2024 4.6 3.5 - 5.2 mmol/L Final         Passed - Patient is not pregnant      Passed - Last BP in normal range    BP Readings from Last 1 Encounters:  06/13/24 134/77         Passed - Valid encounter within last 6 months    Recent Outpatient Visits           1 month ago Essential hypertension   [place] [facility] [place]. [facility] Barnie NOVAK, MD   4 months ago Polyarthritis of multiple sites   [facility] [place] Barnie NOVAK, MD   7 months ago Mixed hyperlipidemia   [place] [facility] [place] [facility] [place]. [facility] Barnie NOVAK, MD   11 months ago White coat syndrome with diagnosis of hypertension   [place] [facility] [place] Of [place]. [facility] Barnie NOVAK, MD   1 year ago White coat syndrome with diagnosis of hypertension   [place] [facility] [place] - [facility] Dept Of . [facility] Barnie NOVAK, MD

## 2024-07-16 ENCOUNTER — Encounter: Payer: Self-pay | Admitting: Internal Medicine

## 2024-07-30 ENCOUNTER — Ambulatory Visit: Payer: Self-pay | Admitting: Cardiology

## 2024-07-30 ENCOUNTER — Ambulatory Visit (HOSPITAL_COMMUNITY)
Admission: RE | Admit: 2024-07-30 | Discharge: 2024-07-30 | Disposition: A | Discharge status: Home / Self Care | Source: Ambulatory Visit | Attending: Cardiology | Admitting: Cardiology

## 2024-07-30 DIAGNOSIS — I361 Nonrheumatic tricuspid (valve) insufficiency: Secondary | ICD-10-CM | POA: Diagnosis not present

## 2024-07-30 DIAGNOSIS — R002 Palpitations: Secondary | ICD-10-CM | POA: Insufficient documentation

## 2024-07-30 LAB — ECHOCARDIOGRAM COMPLETE
Area-P 1/2: 4.3 cm2
MV M vel: 4.81 m/s
MV Peak grad: 92.5 mmHg
S' Lateral: 2.9 cm

## 2024-07-30 MED ORDER — PERFLUTREN LIPID MICROSPHERE
1.0000 mL | INTRAVENOUS | Status: AC | PRN
  Administered 2024-07-30: 1 mL via INTRAVENOUS

## 2024-10-10 ENCOUNTER — Ambulatory Visit: Admitting: Internal Medicine

## 2024-11-14 ENCOUNTER — Ambulatory Visit: Admitting: Internal Medicine
